# Patient Record
Sex: Female | Born: 1950 | Race: White | Hispanic: No | Marital: Married | Smoking: Former smoker
Health system: Southern US, Community
[De-identification: ages and names within clinical notes are randomized; demographics above are authoritative.]

## PROBLEM LIST (undated history)

## (undated) DIAGNOSIS — J302 Other seasonal allergic rhinitis: Secondary | ICD-10-CM

## (undated) DIAGNOSIS — K219 Gastro-esophageal reflux disease without esophagitis: Secondary | ICD-10-CM

## (undated) DIAGNOSIS — J45909 Unspecified asthma, uncomplicated: Secondary | ICD-10-CM

## (undated) DIAGNOSIS — K5790 Diverticulosis of intestine, part unspecified, without perforation or abscess without bleeding: Secondary | ICD-10-CM

## (undated) DIAGNOSIS — R51 Headache: Secondary | ICD-10-CM

## (undated) DIAGNOSIS — I671 Cerebral aneurysm, nonruptured: Secondary | ICD-10-CM

## (undated) HISTORY — DX: Other seasonal allergic rhinitis: J30.2

## (undated) HISTORY — PX: COLONOSCOPY W/ BIOPSIES AND POLYPECTOMY: SHX1376

## (undated) HISTORY — DX: Cerebral aneurysm, nonruptured: I67.1

---

## 1978-03-08 HISTORY — PX: FACIAL FRACTURE SURGERY: SHX1570

## 1992-03-08 HISTORY — PX: ROTATOR CUFF REPAIR: SHX139

## 2007-03-09 HISTORY — PX: TENDON REPAIR: SHX5111

## 2010-06-08 ENCOUNTER — Other Ambulatory Visit (HOSPITAL_COMMUNITY): Payer: Self-pay | Admitting: *Deleted

## 2010-06-08 DIAGNOSIS — Z1231 Encounter for screening mammogram for malignant neoplasm of breast: Secondary | ICD-10-CM

## 2010-06-29 ENCOUNTER — Ambulatory Visit (HOSPITAL_COMMUNITY)
Admission: RE | Admit: 2010-06-29 | Discharge: 2010-06-29 | Disposition: A | Discharge status: Home / Self Care | Payer: BC Managed Care – PPO | Source: Ambulatory Visit | Attending: Gynecology | Admitting: Gynecology

## 2010-06-29 DIAGNOSIS — Z1231 Encounter for screening mammogram for malignant neoplasm of breast: Secondary | ICD-10-CM

## 2012-03-29 ENCOUNTER — Other Ambulatory Visit (HOSPITAL_COMMUNITY): Payer: Self-pay | Admitting: *Deleted

## 2012-03-29 DIAGNOSIS — Z1231 Encounter for screening mammogram for malignant neoplasm of breast: Secondary | ICD-10-CM

## 2012-03-31 ENCOUNTER — Ambulatory Visit (HOSPITAL_COMMUNITY)
Admission: RE | Admit: 2012-03-31 | Discharge: 2012-03-31 | Disposition: A | Discharge status: Home / Self Care | Payer: BC Managed Care – PPO | Source: Ambulatory Visit | Attending: Internal Medicine | Admitting: Internal Medicine

## 2012-03-31 DIAGNOSIS — Z1231 Encounter for screening mammogram for malignant neoplasm of breast: Secondary | ICD-10-CM | POA: Insufficient documentation

## 2012-05-19 ENCOUNTER — Encounter: Payer: Self-pay | Admitting: Internal Medicine

## 2012-06-13 ENCOUNTER — Encounter: Payer: Self-pay | Admitting: Internal Medicine

## 2012-06-13 ENCOUNTER — Ambulatory Visit (AMBULATORY_SURGERY_CENTER): Payer: BC Managed Care – PPO | Admitting: *Deleted

## 2012-06-13 ENCOUNTER — Telehealth: Payer: Self-pay | Admitting: *Deleted

## 2012-06-13 VITALS — Ht 66.0 in | Wt 139.6 lb

## 2012-06-13 DIAGNOSIS — Z1211 Encounter for screening for malignant neoplasm of colon: Secondary | ICD-10-CM

## 2012-06-13 MED ORDER — MOVIPREP 100 G PO SOLR: ORAL | Status: DC

## 2012-06-13 NOTE — Telephone Encounter (Signed)
Pt scheduled for direct colonoscopy with Dr. Juanda Chance on 06/21/2012.  Last colonoscopy 2005 with Dr. Ewell Poe in [place], [place]. Per pathology report; diminutive sessile tubular adenoma at splenic flexure.  Pt brought procedure report and pathology report to PV appt.  Reports placed on Dr. Regino Schultze desk for review.  Dottie; I told pt in PV that you would call her after Dr. Juanda Chance reviews reports to confirm that she should keep colonoscopy appt scheduled for 4/16.  Thanks, Olegario Messier

## 2012-06-13 NOTE — Telephone Encounter (Signed)
Noted  

## 2012-06-13 NOTE — Progress Notes (Signed)
Pt scheduled for direct colonoscopy with Dr. Juanda Chance on 06/21/2012.  Last colonoscopy 2005 with Dr. Ewell Poe in [place], [place].  Pt brought procedure report and pathology report to PV appt.  Reports placed on Dr. Regino Schultze desk for review.

## 2012-06-14 ENCOUNTER — Encounter: Payer: Self-pay | Admitting: Internal Medicine

## 2012-06-14 NOTE — Telephone Encounter (Signed)
Dr Juanda Chance has reviewed patient's colonoscopy procedure and pathology from Alexandra Dennis ([facility]) done 07/22/03. Dr Juanda Chance states that patient's recall colonoscopy is due at this time. Patient is already scheduled for colonoscopy on 06/21/12.

## 2012-06-14 NOTE — Telephone Encounter (Signed)
Patient advised that Dr Juanda Chance says she is due for colonoscopy, therefore, she should keep her appointment which is scheduled for 06/21/12. She verbalizes understanding.

## 2012-06-21 ENCOUNTER — Ambulatory Visit (AMBULATORY_SURGERY_CENTER): Payer: BC Managed Care – PPO | Admitting: Internal Medicine

## 2012-06-21 ENCOUNTER — Encounter: Payer: Self-pay | Admitting: Internal Medicine

## 2012-06-21 VITALS — BP 123/77 | HR 67 | Temp 99.4°F | Resp 17 | Ht 66.0 in | Wt 139.0 lb

## 2012-06-21 DIAGNOSIS — D126 Benign neoplasm of colon, unspecified: Secondary | ICD-10-CM

## 2012-06-21 DIAGNOSIS — Z1211 Encounter for screening for malignant neoplasm of colon: Secondary | ICD-10-CM

## 2012-06-21 DIAGNOSIS — Z86010 Personal history of colonic polyps: Secondary | ICD-10-CM

## 2012-06-21 MED ORDER — SODIUM CHLORIDE 0.9 % IV SOLN: 500.0000 mL | INTRAVENOUS | Status: DC

## 2012-06-21 NOTE — Progress Notes (Signed)
No complaints noted in the recovery room. Maw   

## 2012-06-21 NOTE — Progress Notes (Signed)
Lidocaine-40mg IV prior to Propofol InductionPropofol given over incremental dosages 

## 2012-06-21 NOTE — Progress Notes (Signed)
Patient did not have preoperative order for IV antibiotic SSI prophylaxis. (G8918)Patient did not experience any of the following events: a burn prior to discharge; a fall within the facility; wrong site/side/patient/procedure/implant event; or a hospital transfer or hospital admission upon discharge from the facility. [phone])   Pt had to wait for Dr. Juanda Chance to speak with her before discharge.  Maw  Pt hands were very cold and I placed a warm pack to her hands.  She states, "it feels much better:Marland Kitchen Maw

## 2012-06-21 NOTE — Op Note (Signed)
[facility] [address] [place], [address]   COLONOSCOPY PROCEDURE REPORT  PATIENT: Alexandra Dennis, Alexandra Dennis  MR#: [id number] BIRTHDATE: 06/11/1950 , 61  yrs. old GENDER: Female ENDOSCOPIST: Hart Carwin, MD REFERRED BY:  Kellie Shropshire, M.D. PROCEDURE DATE:  06/21/2012 PROCEDURE:   Colonoscopy with cold biopsy polypectomy ASA CLASS:   Class II INDICATIONS:colonoscopy in 07/2003 in [place]- tubular adenoma removed from splenic flexure. MEDICATIONS: MAC sedation, administered by CRNA and propofol (Diprivan) 300mg  IV  DESCRIPTION OF PROCEDURE:   After the risks and benefits and of the procedure were explained, informed consent was obtained.  A digital rectal exam revealed no abnormalities of the rectum.    The LB [id number]-[id number]  endoscope was introduced through the anus and advanced to the cecum, which was identified by both the appendix and ileocecal valve .  The quality of the prep was excellent, using MoviPrep .  The instrument was then slowly withdrawn as the colon was fully examined.     COLON FINDINGS: A smooth sessile polyp ranging between 3-85mm in size was found in the sigmoid colon.at 40 cm, A polypectomy was performed with cold forceps.  The resection was complete and the polyp tissue was completely retrieved.   There was moderate diverticulosis noted in the sigmoid colon with associated angulation, tortuosity, muscular hypertrophy and luminal narrowing. Pediatric colonoscope unable to negotiate the turn at 20 cm, so upper endoscope was used to perform the colonoscopy to the cecum. Moderate sized internal hemorrhoids were found.     Retroflexed views revealed internal hemorrhoids.     The scope was then withdrawn from the patient and the procedure completed.  COMPLICATIONS: There were no complications. ENDOSCOPIC IMPRESSION: 1.   Sessile polyp ranging between 3-98mm in size was found in the sigmoid colon; polypectomy was performed with cold  forceps 2.   There was moderate diverticulosis noted in the sigmoid colon, sharp angulation at the pelvic rim, 20 cm,  upper endoscope passed with some difficulty 3.   Moderate sized internal hemorrhoids  RECOMMENDATIONS: 1.  Await pathology results 2.  High fiber diet 3. soluable fiber 2 tsp daily   REPEAT EXAM: .  pending polyp pathology  cc:  _______________________________ eSignedHart Carwin, MD 06/21/2012 1:02 PM     PATIENT NAME:  Alexandra Dennis, Alexandra Dennis MR#: [id number]

## 2012-06-21 NOTE — Progress Notes (Signed)
Called to room to assist during endoscopic procedure.  Patient ID and intended procedure confirmed with present staff. Received instructions for my participation in the procedure from the performing physician.  

## 2012-06-21 NOTE — Patient Instructions (Signed)
Handouts were given to your care partner on polyps, diverticulosis, high fiber diet and hemorrhoids.  Per Dr. Juanda Chance add soulable fiber 2 teaspoons daily.  You may resume your current medications today.  Await pathology results.  Please call if any questions or concerns.     YOU HAD AN ENDOSCOPIC PROCEDURE TODAY AT THE [facility]: Refer to the procedure report that was given to you for any specific questions about what was found during the examination.  If the procedure report does not answer your questions, please call your gastroenterologist to clarify.  If you requested that your care partner not be given the details of your procedure findings, then the procedure report has been included in a sealed envelope for you to review at your convenience later.  YOU SHOULD EXPECT: Some feelings of bloating in the abdomen. Passage of more gas than usual.  Walking can help get rid of the air that was put into your GI tract during the procedure and reduce the bloating. If you had a lower endoscopy (such as a colonoscopy or flexible sigmoidoscopy) you may notice spotting of blood in your stool or on the toilet paper. If you underwent a bowel prep for your procedure, then you may not have a normal bowel movement for a few days.  DIET: Your first meal following the procedure should be a light meal and then it is ok to progress to your normal diet.  A half-sandwich or bowl of soup is an example of a good first meal.  Heavy or fried foods are harder to digest and may make you feel nauseous or bloated.  Likewise meals heavy in dairy and vegetables can cause extra gas to form and this can also increase the bloating.  Drink plenty of fluids but you should avoid alcoholic beverages for 24 hours.  ACTIVITY: Your care partner should take you home directly after the procedure.  You should plan to take it easy, moving slowly for the rest of the day.  You can resume normal activity the day after the procedure  however you should NOT DRIVE or use heavy machinery for 24 hours (because of the sedation medicines used during the test).    SYMPTOMS TO REPORT IMMEDIATELY: A gastroenterologist can be reached at any hour.  During normal business hours, 8:30 AM to 5:00 PM Monday through Friday, call [phone].  After hours and on weekends, please call the GI answering service at [phone] who will take a message and have the physician on call contact you.   Following lower endoscopy (colonoscopy or flexible sigmoidoscopy):  Excessive amounts of blood in the stool  Significant tenderness or worsening of abdominal pains  Swelling of the abdomen that is new, acute  Fever of 100F or higher    FOLLOW UP: If any biopsies were taken you will be contacted by phone or by letter within the next 1-3 weeks.  Call your gastroenterologist if you have not heard about the biopsies in 3 weeks.  Our staff will call the home number listed on your records the next business day following your procedure to check on you and address any questions or concerns that you may have at that time regarding the information given to you following your procedure. This is a courtesy call and so if there is no answer at the home number and we have not heard from you through the emergency physician on call, we will assume that you have returned to your regular daily activities without  incident.  SIGNATURES/CONFIDENTIALITY: You and/or your care partner have signed paperwork which will be entered into your electronic medical record.  These signatures attest to the fact that that the information above on your After Visit Summary has been reviewed and is understood.  Full responsibility of the confidentiality of this discharge information lies with you and/or your care-partner.

## 2012-06-22 ENCOUNTER — Telehealth: Payer: Self-pay

## 2012-06-22 NOTE — Telephone Encounter (Signed)
  Follow up Call-  Call back number 06/21/2012  Post procedure Call Back phone  # [phone]  Permission to leave phone message Yes     Patient questions:  Do you have a fever, pain , or abdominal swelling? no Pain Score  0 *  Have you tolerated food without any problems? yes  Have you been able to return to your normal activities? yes  Do you have any questions about your discharge instructions: Diet   no Medications  no Follow up visit  no  Do you have questions or concerns about your Care? no  Actions: * If pain score is 4 or above: No action needed, pain <4.

## 2012-06-27 ENCOUNTER — Encounter: Payer: BC Managed Care – PPO | Admitting: Internal Medicine

## 2012-06-27 ENCOUNTER — Encounter: Payer: Self-pay | Admitting: Internal Medicine

## 2012-11-15 ENCOUNTER — Other Ambulatory Visit (HOSPITAL_COMMUNITY): Payer: Self-pay | Admitting: Internal Medicine

## 2012-11-15 ENCOUNTER — Other Ambulatory Visit (HOSPITAL_COMMUNITY): Payer: Self-pay | Admitting: Interventional Radiology

## 2012-11-15 DIAGNOSIS — I720 Aneurysm of carotid artery: Secondary | ICD-10-CM

## 2012-11-30 ENCOUNTER — Ambulatory Visit (HOSPITAL_COMMUNITY)
Admission: RE | Admit: 2012-11-30 | Discharge: 2012-11-30 | Disposition: A | Discharge status: Home / Self Care | Payer: BC Managed Care – PPO | Source: Ambulatory Visit | Attending: Internal Medicine | Admitting: Internal Medicine

## 2012-11-30 DIAGNOSIS — I720 Aneurysm of carotid artery: Secondary | ICD-10-CM

## 2013-01-23 ENCOUNTER — Other Ambulatory Visit (HOSPITAL_COMMUNITY): Payer: Self-pay | Admitting: Interventional Radiology

## 2013-01-23 DIAGNOSIS — I729 Aneurysm of unspecified site: Secondary | ICD-10-CM

## 2013-02-05 ENCOUNTER — Other Ambulatory Visit (HOSPITAL_COMMUNITY): Payer: BC Managed Care – PPO

## 2013-02-05 ENCOUNTER — Ambulatory Visit (HOSPITAL_COMMUNITY)
Admission: RE | Admit: 2013-02-05 | Discharge: 2013-02-05 | Disposition: A | Discharge status: Home / Self Care | Payer: BC Managed Care – PPO | Source: Ambulatory Visit | Attending: Interventional Radiology | Admitting: Interventional Radiology

## 2013-02-05 ENCOUNTER — Other Ambulatory Visit (HOSPITAL_COMMUNITY): Payer: Self-pay | Admitting: Interventional Radiology

## 2013-02-05 DIAGNOSIS — I671 Cerebral aneurysm, nonruptured: Secondary | ICD-10-CM | POA: Insufficient documentation

## 2013-02-05 DIAGNOSIS — I729 Aneurysm of unspecified site: Secondary | ICD-10-CM

## 2013-02-05 DIAGNOSIS — M47812 Spondylosis without myelopathy or radiculopathy, cervical region: Secondary | ICD-10-CM | POA: Insufficient documentation

## 2013-02-05 MED ORDER — IOHEXOL 350 MG/ML SOLN
50.0000 mL | Freq: Once | INTRAVENOUS | Status: AC | PRN
  Administered 2013-02-05: 50 mL via INTRAVENOUS

## 2013-02-06 ENCOUNTER — Other Ambulatory Visit (HOSPITAL_COMMUNITY): Payer: Self-pay | Admitting: Interventional Radiology

## 2013-02-06 DIAGNOSIS — I729 Aneurysm of unspecified site: Secondary | ICD-10-CM

## 2013-02-08 ENCOUNTER — Ambulatory Visit (HOSPITAL_COMMUNITY)
Admission: RE | Admit: 2013-02-08 | Discharge: 2013-02-08 | Disposition: A | Discharge status: Home / Self Care | Payer: BC Managed Care – PPO | Source: Ambulatory Visit | Attending: Interventional Radiology | Admitting: Interventional Radiology

## 2013-02-08 DIAGNOSIS — I729 Aneurysm of unspecified site: Secondary | ICD-10-CM

## 2013-02-14 ENCOUNTER — Other Ambulatory Visit (HOSPITAL_COMMUNITY): Payer: Self-pay | Admitting: Interventional Radiology

## 2013-02-14 DIAGNOSIS — I729 Aneurysm of unspecified site: Secondary | ICD-10-CM

## 2013-02-22 ENCOUNTER — Other Ambulatory Visit (HOSPITAL_COMMUNITY): Payer: Self-pay | Admitting: Radiology

## 2013-02-27 ENCOUNTER — Encounter (HOSPITAL_COMMUNITY): Payer: Self-pay | Admitting: Pharmacy Technician

## 2013-02-28 NOTE — Pre-Procedure Instructions (Signed)
[name]  02/28/2013   Your procedure is scheduled on:  03/14/12  Report to [facility] short stay admitting at 630 AM.  Call this number if you have problems the morning of surgery: [phone]   Remember:   Do not eat food or drink liquids after midnight.   Take these medicines the morning of surgery with A SIP OF WATER:    Do not wear jewelry, make-up or nail polish.  Do not wear lotions, powders, or perfumes. You may wear deodorant.  Do not shave 48 hours prior to surgery. Men may shave face and neck.  Do not bring valuables to the hospital.  [facility] is not responsible                  for any belongings or valuables.               Contacts, dentures or bridgework may not be worn into surgery.  Leave suitcase in the car. After surgery it may be brought to your room.  For patients admitted to the hospital, discharge time is determined by your                treatment team.               Patients discharged the day of surgery will not be allowed to drive  home.  Name and phone number of your driver:   Special Instructions: Shower using CHG 2 nights before surgery and the night before surgery.  If you shower the day of surgery use CHG.  Use special wash - you have one bottle of CHG for all showers.  You should use approximately 1/3 of the bottle for each shower.   Please read over the following fact sheets that you were given: Pain Booklet, Coughing and Deep Breathing and Surgical Site Infection Prevention

## 2013-03-02 ENCOUNTER — Encounter (HOSPITAL_COMMUNITY)
Admission: RE | Admit: 2013-03-02 | Discharge: 2013-03-02 | Disposition: A | Discharge status: Home / Self Care | Payer: BC Managed Care – PPO | Source: Ambulatory Visit | Attending: Interventional Radiology | Admitting: Interventional Radiology

## 2013-03-02 ENCOUNTER — Encounter (HOSPITAL_COMMUNITY): Payer: Self-pay

## 2013-03-02 DIAGNOSIS — Z01812 Encounter for preprocedural laboratory examination: Secondary | ICD-10-CM | POA: Insufficient documentation

## 2013-03-02 DIAGNOSIS — Z01818 Encounter for other preprocedural examination: Secondary | ICD-10-CM | POA: Insufficient documentation

## 2013-03-02 HISTORY — DX: Unspecified asthma, uncomplicated: J45.909

## 2013-03-02 HISTORY — DX: Gastro-esophageal reflux disease without esophagitis: K21.9

## 2013-03-02 HISTORY — DX: Diverticulosis of intestine, part unspecified, without perforation or abscess without bleeding: K57.90

## 2013-03-02 HISTORY — DX: Headache: R51

## 2013-03-02 LAB — COMPREHENSIVE METABOLIC PANEL
ALT: 19 U/L (ref 0–35)
AST: 19 U/L (ref 0–37)
Albumin: 3.9 g/dL (ref 3.5–5.2)
CO2: 28 mEq/L (ref 19–32)
Calcium: 9.1 mg/dL (ref 8.4–10.5)
Creatinine, Ser: 0.72 mg/dL (ref 0.50–1.10)
GFR calc non Af Amer: >90 mL/min (ref >90)
Sodium: 141 mEq/L (ref 135–145)
Total Bilirubin: 0.2 mg/dL — ABNORMAL LOW (ref 0.3–1.2)
Total Protein: 7 g/dL (ref 6.0–8.3)

## 2013-03-02 LAB — CBC WITH DIFFERENTIAL/PLATELET
Basophils Absolute: 0 10*3/uL (ref 0.0–0.1)
Basophils Relative: 0 % (ref 0–1)
Eosinophils Absolute: 0.2 10*3/uL (ref 0.0–0.7)
Eosinophils Relative: 3 % (ref 0–5)
HCT: 40.4 % (ref 36.0–46.0)
Lymphocytes Relative: 34 % (ref 12–46)
MCH: 30.7 pg (ref 26.0–34.0)
MCHC: 33.7 g/dL (ref 30.0–36.0)
MCV: 91.2 fL (ref 78.0–100.0)
Monocytes Absolute: 0.6 10*3/uL (ref 0.1–1.0)
Platelets: 308 10*3/uL (ref 150–400)
RBC: 4.43 MIL/uL (ref 3.87–5.11)
RDW: 12.9 % (ref 11.5–15.5)
WBC: 7.1 10*3/uL (ref 4.0–10.5)

## 2013-03-02 LAB — PROTIME-INR: INR: 0.89 (ref 0.00–1.49)

## 2013-03-05 ENCOUNTER — Other Ambulatory Visit (HOSPITAL_COMMUNITY): Payer: Self-pay | Admitting: Radiology

## 2013-03-05 ENCOUNTER — Encounter (HOSPITAL_COMMUNITY): Payer: Self-pay
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
## 2013-03-09 ENCOUNTER — Other Ambulatory Visit (HOSPITAL_COMMUNITY): Payer: Self-pay | Admitting: Radiology

## 2013-03-12 ENCOUNTER — Encounter (HOSPITAL_COMMUNITY): Payer: Self-pay | Admitting: Pharmacy Technician

## 2013-03-14 ENCOUNTER — Inpatient Hospital Stay (HOSPITAL_COMMUNITY)
Admission: RE | Admit: 2013-03-14 | Discharge: 2013-03-15 | DRG: 027 | Disposition: A | Discharge status: Home / Self Care | Payer: BC Managed Care – PPO | Source: Ambulatory Visit | Attending: Interventional Radiology | Admitting: Interventional Radiology

## 2013-03-14 ENCOUNTER — Encounter (HOSPITAL_COMMUNITY)
Admission: RE | Disposition: A | Discharge status: Home / Self Care | Payer: Self-pay | Source: Ambulatory Visit | Attending: Interventional Radiology

## 2013-03-14 ENCOUNTER — Ambulatory Visit (HOSPITAL_COMMUNITY)
Admission: RE | Admit: 2013-03-14 | Discharge: 2013-03-14 | Disposition: A | Discharge status: Home / Self Care | Payer: BC Managed Care – PPO | Source: Ambulatory Visit | Attending: Interventional Radiology | Admitting: Interventional Radiology

## 2013-03-14 ENCOUNTER — Ambulatory Visit (HOSPITAL_COMMUNITY): Payer: BC Managed Care – PPO | Admitting: Certified Registered"

## 2013-03-14 ENCOUNTER — Encounter (HOSPITAL_COMMUNITY): Payer: Self-pay | Admitting: *Deleted

## 2013-03-14 ENCOUNTER — Encounter (HOSPITAL_COMMUNITY): Payer: BC Managed Care – PPO | Admitting: Vascular Surgery

## 2013-03-14 ENCOUNTER — Encounter (HOSPITAL_COMMUNITY): Payer: Self-pay

## 2013-03-14 DIAGNOSIS — Z7902 Long term (current) use of antithrombotics/antiplatelets: Secondary | ICD-10-CM

## 2013-03-14 DIAGNOSIS — I729 Aneurysm of unspecified site: Secondary | ICD-10-CM

## 2013-03-14 DIAGNOSIS — K219 Gastro-esophageal reflux disease without esophagitis: Secondary | ICD-10-CM | POA: Diagnosis present

## 2013-03-14 DIAGNOSIS — I671 Cerebral aneurysm, nonruptured: Secondary | ICD-10-CM

## 2013-03-14 DIAGNOSIS — Z7982 Long term (current) use of aspirin: Secondary | ICD-10-CM

## 2013-03-14 DIAGNOSIS — Z87891 Personal history of nicotine dependence: Secondary | ICD-10-CM

## 2013-03-14 HISTORY — PX: RADIOLOGY WITH ANESTHESIA: SHX6223

## 2013-03-14 LAB — ABO/RH: ABO/RH(D): B POS

## 2013-03-14 LAB — POCT ACTIVATED CLOTTING TIME
ACTIVATED CLOTTING TIME: 143 s
ACTIVATED CLOTTING TIME: 177 s

## 2013-03-14 LAB — HEPARIN LEVEL (UNFRACTIONATED): HEPARIN UNFRACTIONATED: 0.25 [IU]/mL — AB (ref 0.30–0.70)

## 2013-03-14 LAB — TYPE AND SCREEN
ABO/RH(D): B POS
Antibody Screen: NEGATIVE

## 2013-03-14 LAB — MRSA PCR SCREENING: MRSA BY PCR: NEGATIVE

## 2013-03-14 LAB — PLATELET INHIBITION P2Y12: Platelet Function  P2Y12: 8 [PRU] — ABNORMAL LOW (ref 194–418)

## 2013-03-14 SURGERY — RADIOLOGY WITH ANESTHESIA
Anesthesia: Monitor Anesthesia Care

## 2013-03-14 MED ORDER — PROMETHAZINE HCL 25 MG/ML IJ SOLN: 6.2500 mg | INTRAMUSCULAR | Status: DC | PRN

## 2013-03-14 MED ORDER — PANTOPRAZOLE SODIUM 40 MG IV SOLR
40.0000 mg | INTRAVENOUS | Status: DC
  Filled 2013-03-14: qty 40

## 2013-03-14 MED ORDER — CLOPIDOGREL BISULFATE 75 MG PO TABS
75.0000 mg | ORAL_TABLET | ORAL | Status: DC
  Filled 2013-03-14: qty 1

## 2013-03-14 MED ORDER — SODIUM CHLORIDE 0.9 % IV SOLN
Freq: Once | INTRAVENOUS | Status: AC
  Administered 2013-03-14: 08:00:00 via INTRAVENOUS

## 2013-03-14 MED ORDER — LACTATED RINGERS IV SOLN
INTRAVENOUS | Status: DC | PRN
  Administered 2013-03-14 (×2): via INTRAVENOUS

## 2013-03-14 MED ORDER — SODIUM CHLORIDE 0.9 % IV SOLN
INTRAVENOUS | Status: DC
  Administered 2013-03-14: 15:00:00 via INTRAVENOUS

## 2013-03-14 MED ORDER — PROPOFOL 10 MG/ML IV BOLUS
INTRAVENOUS | Status: DC | PRN
  Administered 2013-03-14: 150 mg via INTRAVENOUS

## 2013-03-14 MED ORDER — MEPERIDINE HCL 25 MG/ML IJ SOLN: 6.2500 mg | INTRAMUSCULAR | Status: DC | PRN

## 2013-03-14 MED ORDER — HEPARIN (PORCINE) IN NACL 100-0.45 UNIT/ML-% IJ SOLN
500.0000 [IU]/h | INTRAMUSCULAR | Status: AC
  Administered 2013-03-14: 500 [IU]/h via INTRAVENOUS
  Filled 2013-03-14: qty 250

## 2013-03-14 MED ORDER — EPHEDRINE SULFATE 50 MG/ML IJ SOLN
INTRAMUSCULAR | Status: DC | PRN
  Administered 2013-03-14: 5 mg via INTRAVENOUS

## 2013-03-14 MED ORDER — ASPIRIN EC 325 MG PO TBEC
325.0000 mg | DELAYED_RELEASE_TABLET | ORAL | Status: DC
  Filled 2013-03-14: qty 1

## 2013-03-14 MED ORDER — ROCURONIUM BROMIDE 100 MG/10ML IV SOLN
INTRAVENOUS | Status: DC | PRN
  Administered 2013-03-14: 40 mg via INTRAVENOUS

## 2013-03-14 MED ORDER — OXYCODONE HCL 5 MG/5ML PO SOLN: 5.0000 mg | Freq: Once | ORAL | Status: DC | PRN

## 2013-03-14 MED ORDER — OXYCODONE HCL 5 MG PO TABS: 5.0000 mg | ORAL_TABLET | Freq: Once | ORAL | Status: DC | PRN

## 2013-03-14 MED ORDER — NIMODIPINE 30 MG PO CAPS
ORAL_CAPSULE | ORAL | Status: AC
  Filled 2013-03-14: qty 2

## 2013-03-14 MED ORDER — IOHEXOL 300 MG/ML  SOLN
150.0000 mL | Freq: Once | INTRAMUSCULAR | Status: AC | PRN
  Administered 2013-03-14: 135 mL via INTRAVENOUS

## 2013-03-14 MED ORDER — MIDAZOLAM HCL 5 MG/5ML IJ SOLN
INTRAMUSCULAR | Status: DC | PRN
  Administered 2013-03-14: 1 mg via INTRAVENOUS
  Administered 2013-03-14: 2 mg via INTRAVENOUS
  Administered 2013-03-14: 1 mg via INTRAVENOUS

## 2013-03-14 MED ORDER — CEFAZOLIN SODIUM-DEXTROSE 2-3 GM-% IV SOLR
INTRAVENOUS | Status: AC
  Filled 2013-03-14: qty 50

## 2013-03-14 MED ORDER — HEPARIN SODIUM (PORCINE) 1000 UNIT/ML IJ SOLN
INTRAMUSCULAR | Status: DC | PRN
  Administered 2013-03-14: 500 [IU] via INTRAVENOUS
  Administered 2013-03-14: 3000 [IU] via INTRAVENOUS

## 2013-03-14 MED ORDER — GELATIN ABSORBABLE 12-7 MM EX MISC
CUTANEOUS | Status: AC
  Filled 2013-03-14: qty 1

## 2013-03-14 MED ORDER — FENTANYL CITRATE 0.05 MG/ML IJ SOLN
INTRAMUSCULAR | Status: DC | PRN
  Administered 2013-03-14: 50 ug via INTRAVENOUS
  Administered 2013-03-14: 100 ug via INTRAVENOUS
  Administered 2013-03-14 (×2): 50 ug via INTRAVENOUS

## 2013-03-14 MED ORDER — CLOPIDOGREL BISULFATE 75 MG PO TABS
75.0000 mg | ORAL_TABLET | Freq: Every day | ORAL | Status: DC
  Administered 2013-03-15: 75 mg via ORAL
  Filled 2013-03-14 (×2): qty 1

## 2013-03-14 MED ORDER — ONDANSETRON HCL 4 MG/2ML IJ SOLN: 4.0000 mg | Freq: Four times a day (QID) | INTRAMUSCULAR | Status: DC | PRN

## 2013-03-14 MED ORDER — ONDANSETRON HCL 4 MG/2ML IJ SOLN
INTRAMUSCULAR | Status: DC | PRN
  Administered 2013-03-14: 4 mg via INTRAVENOUS

## 2013-03-14 MED ORDER — ASPIRIN 325 MG PO TABS
325.0000 mg | ORAL_TABLET | Freq: Every day | ORAL | Status: DC
  Administered 2013-03-15: 325 mg via ORAL
  Filled 2013-03-14 (×2): qty 1

## 2013-03-14 MED ORDER — NITROGLYCERIN 1 MG/10 ML FOR IR/CATH LAB
INTRA_ARTERIAL | Status: AC
  Filled 2013-03-14: qty 10

## 2013-03-14 MED ORDER — NICARDIPINE HCL IN NACL 20-0.86 MG/200ML-% IV SOLN: 5.0000 mg/h | INTRAVENOUS | Status: DC

## 2013-03-14 MED ORDER — NIMODIPINE 30 MG PO CAPS
60.0000 mg | ORAL_CAPSULE | ORAL | Status: AC
  Administered 2013-03-14: 60 mg via ORAL
  Filled 2013-03-14: qty 2

## 2013-03-14 MED ORDER — NEOSTIGMINE METHYLSULFATE 1 MG/ML IJ SOLN
INTRAMUSCULAR | Status: DC | PRN
  Administered 2013-03-14: 3 mg via INTRAVENOUS

## 2013-03-14 MED ORDER — ACETAMINOPHEN 650 MG RE SUPP
650.0000 mg | Freq: Four times a day (QID) | RECTAL | Status: DC | PRN
Start: 2013-03-14 — End: 2013-03-15

## 2013-03-14 MED ORDER — LIDOCAINE HCL (CARDIAC) 20 MG/ML IV SOLN
INTRAVENOUS | Status: DC | PRN
  Administered 2013-03-14 (×2): 30 mg via INTRAVENOUS

## 2013-03-14 MED ORDER — CEFAZOLIN SODIUM-DEXTROSE 2-3 GM-% IV SOLR
2.0000 g | Freq: Once | INTRAVENOUS | Status: AC
  Administered 2013-03-14: 2 g via INTRAVENOUS
  Filled 2013-03-14: qty 50

## 2013-03-14 MED ORDER — PANTOPRAZOLE SODIUM 40 MG IV SOLR
40.0000 mg | Freq: Once | INTRAVENOUS | Status: AC
  Administered 2013-03-14: 40 mg via INTRAVENOUS

## 2013-03-14 MED ORDER — DEXAMETHASONE SODIUM PHOSPHATE 4 MG/ML IJ SOLN
INTRAMUSCULAR | Status: DC | PRN
Start: 2013-03-14 — End: 2013-03-14
  Administered 2013-03-14: 10 mg via INTRAVENOUS

## 2013-03-14 MED ORDER — FENTANYL CITRATE 0.05 MG/ML IJ SOLN: 25.0000 ug | INTRAMUSCULAR | Status: DC | PRN

## 2013-03-14 MED ORDER — DEXTROSE 5 % IV SOLN
10.0000 mg | INTRAVENOUS | Status: DC | PRN
  Administered 2013-03-14: 25 ug/min via INTRAVENOUS

## 2013-03-14 MED ORDER — GLYCOPYRROLATE 0.2 MG/ML IJ SOLN
INTRAMUSCULAR | Status: DC | PRN
  Administered 2013-03-14: .4 mg via INTRAVENOUS
  Administered 2013-03-14: .2 mg via INTRAVENOUS

## 2013-03-14 MED ORDER — ACETAMINOPHEN 500 MG PO TABS
1000.0000 mg | ORAL_TABLET | Freq: Four times a day (QID) | ORAL | Status: DC | PRN
  Administered 2013-03-14: 1000 mg via ORAL
  Filled 2013-03-14: qty 2

## 2013-03-14 NOTE — Preoperative (Signed)
Beta Blockers   Reason not to administer Beta Blockers:Not Applicable 

## 2013-03-14 NOTE — Progress Notes (Signed)
Subjective: Patient with no complaints of right groin pain or swelling, headache or left eye pain. Patient denies any N/V. She states her post intubation oral bleeding has stopped with no recurrence. She is doing well with ice chips. Family present during evaluation.   Objective: Physical Exam: BP 110/67  Pulse 72  Temp(Src) 97.4 F (36.3 C) (Oral)  Resp 19  Ht 5' 6.14" (1.68 m)  Wt 138 lb 3.7 oz (62.7 kg)  BMI 22.22 kg/m2  SpO2 100%  General: A&Ox3, NAD Abd: Soft, NT, ND Right groin site dressing C/D/I, no signs of bleeding or hematoma. Ext: DP intact bilaterally. Neuro: CN grossly intact, speech clear, smile symmetrical, tongue midline, EOMI, PERRLA, extremities equal strength bilaterally upper and lower 5/5. Fine motor intact, no ataxia.   Labs: CBC No results found for this basename: WBC, HGB, HCT, PLT,  in the last 72 hours BMET No results found for this basename: NA, K, CL, CO2, GLUCOSE, BUN, CREATININE, CALCIUM,  in the last 72 hours LFT No results found for this basename: PROT, ALBUMIN, AST, ALT, ALKPHOS, BILITOT, BILIDIR, IBILI, LIPASE,  in the last 72 hours PT/INR No results found for this basename: LABPROT, INR,  in the last 72 hours   Studies/Results: No results found.  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 IV heparin until 0700 03/15/13, Aspirin and plavix daily. Continue to monitor BP closely with parameters. Advance diet to full liquid. Will evaluate in am for possible discharge.    LOS: 0 days    Rockney Ghee 03/14/2013 4:06 PM
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
## 2013-03-14 NOTE — Anesthesia Postprocedure Evaluation (Signed)
  Anesthesia Post-op Note  Patient: Alexandra Dennis  Procedure(s) Performed: Procedure(s): aneurysm  embolization   (N/A)  Patient Location: PACU: neuro  Anesthesia Type:General  Level of Consciousness: awake, alert , oriented and patient cooperative  Airway and Oxygen Therapy: Patient Spontanous Breathing and Patient connected to nasal cannula oxygen  Post-op Pain: none  Post-op Assessment: Post-op Vital signs reviewed, Patient's Cardiovascular Status Stable, Respiratory Function Stable, Patent Airway, No signs of Nausea or vomiting and Pain level controlled  Post-op Vital Signs: Reviewed and stable  Complications: No apparent anesthesia complications

## 2013-03-14 NOTE — Consult Note (Signed)
ANTICOAGULATION CONSULT NOTE - Initial Consult  Pharmacy Consult for Heparin Indication: post-embolization of left carotid artery aneurysm  No Known Allergies  Patient Measurements: Height: 5' 6.14" (168 cm) Weight: 138 lb 3.7 oz (62.7 kg) IBW/kg (Calculated) : 59.63 Heparin Dosing Weight: ~63kg  Vital Signs: Temp: 97.7 F (36.5 C) (01/07 1346) Temp src: Oral (01/07 0658) BP: 135/87 mmHg (01/07 0658) Pulse Rate: 69 (01/07 0658)  Labs: No results found for this basename: HGB, HCT, PLT, APTT, LABPROT, INR, HEPARINUNFRC, CREATININE, CKTOTAL, CKMB, TROPONINI,  in the last 72 hours  Estimated Creatinine Clearance: 69.5 ml/min (by C-G formula based on Cr of 0.72).   Medical History: Past Medical History  Diagnosis Date  . Seasonal allergies   . Left cavernous carotid aneurysm   . Diverticulosis     Hx: of  . Asthma     Hx: of exercise induced as a child only  . GERD (gastroesophageal reflux disease)   . Headache(784.0)    Assessment: 62yof s/p 4 vessel cerebral arteriogram with embolization of left carotid artery aneursym to begin IV heparin until 0700 tomorrow. Heparin to be started in the NPACU at 500 units/hr.  Goal of Therapy:  Heparin level 0.1-0.25 units/ml Monitor platelets by anticoagulation protocol: Yes   Plan:  1) Heparin @ 500 units/hr per MD 2) Check 6 hour heparin level and adjust as needed  Deboraha Sprang 03/14/2013,1:55 PM

## 2013-03-14 NOTE — Consult Note (Signed)
ANTICOAGULATION CONSULT NOTE - Initial Consult  Pharmacy Consult for Heparin Indication: post-embolization of left carotid artery aneurysm  No Known Allergies  Patient Measurements: Height: 5\' 6"  (167.6 cm) Weight: 141 lb 8.6 oz (64.2 kg) IBW/kg (Calculated) : 59.3 Heparin Dosing Weight: ~63kg  Vital Signs: Temp: 97.8 F (36.6 C) (01/07 2000) Temp src: Oral (01/07 2000) BP: 101/58 mmHg (01/07 2100) Pulse Rate: 87 (01/07 2100)  Labs:  Recent Labs  03/14/13 2143  HEPARINUNFRC 0.25*    Estimated Creatinine Clearance: 69.1 ml/min (by C-G formula based on Cr of 0.72).   Medical History: Past Medical History  Diagnosis Date  . Seasonal allergies   . Left cavernous carotid aneurysm   . Diverticulosis     Hx: of  . Asthma     Hx: of exercise induced as a child only  . GERD (gastroesophageal reflux disease)   . Headache(784.0)    Assessment: 62yof s/p 4 vessel cerebral arteriogram with embolization of left carotid artery aneursym to begin IV heparin until 0700 tomorrow. Heparin was started in the NPACU at 500 units/hr.  HL 0.25 at goal.   Goal of Therapy:  Heparin level 0.1-0.25 units/ml Monitor platelets by anticoagulation protocol: Yes   Plan:  1) Continue Heparin @ 500 units/hr per MD - unrill thurned off at 0700 03/15/13  Bonnita Nasuti Pharm.D. CPP, BCPS Clinical Pharmacist [phone] 03/14/2013 10:54 PM

## 2013-03-14 NOTE — Transfer of Care (Signed)
Immediate Anesthesia Transfer of Care Note  Patient: Alexandra Dennis  Procedure(s) Performed: Procedure(s): aneurysm  embolization   (N/A)  Patient Location: PACU  Anesthesia Type:General  Level of Consciousness: awake, alert  and oriented  Airway & Oxygen Therapy: Patient Spontanous Breathing and Patient connected to nasal cannula oxygen  Post-op Assessment: Report given to PACU RN and Post -op Vital signs reviewed and stable  Post vital signs: Reviewed and stable  Complications: No apparent anesthesia complications

## 2013-03-14 NOTE — Procedures (Signed)
S/P 4 vessel cerebral arteriogram. RT CFa approach. Findings. Giant approx 24 mmx 33mm,lobulated  Lt ICa prox aneurysm. S/P placement of 51mmx 15mm Pipeline endovascular device.

## 2013-03-14 NOTE — Anesthesia Preprocedure Evaluation (Addendum)
Anesthesia Evaluation  Patient identified by MRN, date of birth, ID band Patient awake    Reviewed: Allergy & Precautions, H&P , NPO status , Patient's Chart, lab work & pertinent test results  History of Anesthesia Complications Negative for: history of anesthetic complications  Airway Mallampati: II TM Distance: >3 FB Neck ROM: Full    Dental  (+) Caps, Dental Advisory Given and Chipped   Pulmonary former smoker (quit '85),  breath sounds clear to auscultation  Pulmonary exam normal       Cardiovascular + Peripheral Vascular Disease (carotid aneurysm) Rhythm:Regular Rate:Normal     Neuro/Psych  Headaches,    GI/Hepatic Neg liver ROS, GERD-  Controlled,  Endo/Other  negative endocrine ROS  Renal/GU negative Renal ROS     Musculoskeletal   Abdominal   Peds  Hematology negative hematology ROS (+)   Anesthesia Other Findings   Reproductive/Obstetrics                          Anesthesia Physical Anesthesia Plan  ASA: III  Anesthesia Plan: General and MAC   Post-op Pain Management:    Induction: Intravenous  Airway Management Planned: Oral ETT  Additional Equipment: Arterial line  Intra-op Plan:   Post-operative Plan: Extubation in OR  Informed Consent:   Dental advisory given  Plan Discussed with: Surgeon and CRNA  Anesthesia Plan Comments: (Plan routine monitors, A line, MAC for angiogram and GETA once proceed with stent)        Anesthesia Quick Evaluation

## 2013-03-14 NOTE — Progress Notes (Signed)
Spoke with Jannifer Franklin, PA to verify p.o. Status.  OK to advance as tolerated.  Clear liquids ordered at this time.  Also spoke about a-line not correlating with cuff and having a whip in the waveform.  OK to go by cuff pressures which are consistently more accurate.  Will continue to monitor.

## 2013-03-15 LAB — CBC WITH DIFFERENTIAL/PLATELET
BASOS ABS: 0 10*3/uL (ref 0.0–0.1)
BASOS PCT: 0 % (ref 0–1)
Eosinophils Absolute: 0 10*3/uL (ref 0.0–0.7)
Eosinophils Relative: 0 % (ref 0–5)
HEMATOCRIT: 33.7 % — AB (ref 36.0–46.0)
Hemoglobin: 11.6 g/dL — ABNORMAL LOW (ref 12.0–15.0)
Lymphocytes Relative: 22 % (ref 12–46)
Lymphs Abs: 1.7 10*3/uL (ref 0.7–4.0)
MCH: 30.6 pg (ref 26.0–34.0)
MCHC: 34.4 g/dL (ref 30.0–36.0)
MCV: 88.9 fL (ref 78.0–100.0)
MONO ABS: 0.5 10*3/uL (ref 0.1–1.0)
Monocytes Relative: 7 % (ref 3–12)
NEUTROS ABS: 5.5 10*3/uL (ref 1.7–7.7)
NEUTROS PCT: 71 % (ref 43–77)
PLATELETS: 278 10*3/uL (ref 150–400)
RBC: 3.79 MIL/uL — ABNORMAL LOW (ref 3.87–5.11)
RDW: 12.8 % (ref 11.5–15.5)
WBC: 7.8 10*3/uL (ref 4.0–10.5)

## 2013-03-15 LAB — BASIC METABOLIC PANEL
BUN: 7 mg/dL (ref 6–23)
CHLORIDE: 109 meq/L (ref 96–112)
CO2: 22 mEq/L (ref 19–32)
CREATININE: 0.59 mg/dL (ref 0.50–1.10)
Calcium: 8.3 mg/dL — ABNORMAL LOW (ref 8.4–10.5)
GFR calc Af Amer: >90 mL/min (ref >90)
Glucose, Bld: 108 mg/dL — ABNORMAL HIGH (ref 70–99)
Potassium: 3.9 mEq/L (ref 3.7–5.3)
Sodium: 142 mEq/L (ref 137–147)

## 2013-03-15 LAB — PLATELET INHIBITION P2Y12: Platelet Function  P2Y12: 12 [PRU] — ABNORMAL LOW (ref 194–418)

## 2013-03-15 NOTE — Discharge Summary (Signed)
Physician Discharge Summary  Patient ID: Alexandra Dennis MRN: [id number] DOB/AGE: [birth year] 62 y.o.  Admit date: 03/14/2013 Discharge date: 03/15/2013  Admission Diagnoses: Left carotid artery cavernous sinus aneurysm  Discharge Diagnoses: embolization of brain aneurysm Active Problems:   Brain aneurysm   Discharged Condition: improved; stable  Hospital Course: Pt has hx of known L carotid artery aneurysm. Was evaluated in [place] 5-6 yrs ago after symptoms of L eye blurriness and headache. Was referred to [facility] where cerebral arteriogram confirmed L carotid artery cavernous sinus aneurysm.  Was followed there without intervention. Sxs resolved and has remained without sxs. Moved from [place] and followed with CTA. Most recent CTA 12/14 revealed increase in size of aneurysm. Referred to Dr Estanislado Pandy for evaluation and treatment.  Pipeline stent was placed 03/14/2013. Procedure went well without complication. Admitted overnight in Neuro ICU. No issues; uneventful overnight stay. Did have minimal oozing from groin site; new bandage; Hep off at 7 am- no bleeding now. Has had a mild headache that resolved with Tylenol.  Denies N/V; denies headache; visual or speech problems. Dr Estanislado Pandy has seen and examined pt. For dc to home now. Continue all home meds including Plavix 75 mg and ASA 81 mg daily.  Consults: None  Significant Diagnostic Studies: Cerebral arteriogram  Treatments: Pipeline stent placed over L carotid artery aneurysm  Discharge Exam: Blood pressure 125/69, pulse 76, temperature 97.4 F (36.3 C), temperature source Oral, resp. rate 15, height 5\' 6"  (1.676 m), weight 141 lb 8.6 oz (64.2 kg), SpO2 99.00%.  PE:  Afeb; vss In NAD; A/O Pleasant Face symmetrical; tongue midline Smile =; EOM; puffs cheeks = Good strength; good movement all 4s Sensation; strength = B Heart: RRR Lungs: CTA Abd: soft; +BS; NT Extr: FROM Rt groin: no hematoma; dry; NT Small ecchymotic area  superior to groin site (was there before procedure per pt) Rt foot: 2+ pulses UOP good; yellow Labs wnl  Results for orders placed during the hospital encounter of 03/14/13  MRSA PCR SCREENING      Result Value Range   MRSA by PCR NEGATIVE  NEGATIVE  PLATELET INHIBITION P2Y12      Result Value Range   Platelet Function  P2Y12 8 (*) 194 - 418 PRU  BASIC METABOLIC PANEL      Result Value Range   Sodium 142  137 - 147 mEq/L   Potassium 3.9  3.7 - 5.3 mEq/L   Chloride 109  96 - 112 mEq/L   CO2 22  19 - 32 mEq/L   Glucose, Bld 108 (*) 70 - 99 mg/dL   BUN 7  6 - 23 mg/dL   Creatinine, Ser 0.59  0.50 - 1.10 mg/dL   Calcium 8.3 (*) 8.4 - 10.5 mg/dL   GFR calc non Af Amer >90  >90 mL/min   GFR calc Af Amer >90  >90 mL/min  CBC WITH DIFFERENTIAL      Result Value Range   WBC 7.8  4.0 - 10.5 K/uL   RBC 3.79 (*) 3.87 - 5.11 MIL/uL   Hemoglobin 11.6 (*) 12.0 - 15.0 g/dL   HCT 33.7 (*) 36.0 - 46.0 %   MCV 88.9  78.0 - 100.0 fL   MCH 30.6  26.0 - 34.0 pg   MCHC 34.4  30.0 - 36.0 g/dL   RDW 12.8  11.5 - 15.5 %   Platelets 278  150 - 400 K/uL   Neutrophils Relative % 71  43 - 77 %   Neutro Abs  5.5  1.7 - 7.7 K/uL   Lymphocytes Relative 22  12 - 46 %   Lymphs Abs 1.7  0.7 - 4.0 K/uL   Monocytes Relative 7  3 - 12 %   Monocytes Absolute 0.5  0.1 - 1.0 K/uL   Eosinophils Relative 0  0 - 5 %   Eosinophils Absolute 0.0  0.0 - 0.7 K/uL   Basophils Relative 0  0 - 1 %   Basophils Absolute 0.0  0.0 - 0.1 K/uL  HEPARIN LEVEL (UNFRACTIONATED)      Result Value Range   Heparin Unfractionated 0.25 (*) 0.30 - 0.70 IU/mL  TYPE AND SCREEN      Result Value Range   ABO/RH(D) B POS     Antibody Screen NEG     Sample Expiration 03/17/2013    ABO/RH      Result Value Range   ABO/RH(D) B POS      Disposition: L carotid artery cavernous sinus aneurysm pipeline stent placed 1/7 in IR with Dr Estanislado Pandy and anesthesia. Doing well; dc to home now. Dr Estanislado Pandy has seen and examined pt. Return 1/21  for follow up. Continue ASA 81 mg and Plavix 75 mg daily. Pt has good understanding of dc instructions. P2Y12 stat now before dc. Call [phone] or [phone] if problems or questions.  Discharge Orders   Future Orders Complete By Expires   Call MD for:  difficulty breathing, headache or visual disturbances  As directed    Call MD for:  extreme fatigue  As directed    Call MD for:  persistant dizziness or light-headedness  As directed    Call MD for:  persistant nausea and vomiting  As directed    Call MD for:  redness, tenderness, or signs of infection (pain, swelling, redness, odor or green/yellow discharge around incision site)  As directed    Call MD for:  severe uncontrolled pain  As directed    Call MD for:  temperature >100.4  As directed    Diet - low sodium heart healthy  As directed    Discharge instructions  As directed    Comments:     Follow up with Dr Estanislado Pandy 1/21 Wed at 100 pm- [facility]; call [phone] with issues or questions   Discharge wound care:  As directed    Comments:     May shower tomorrow; replace bandaid to Rt groin daily x 1 week   Driving Restrictions  As directed    Comments:     No driving x 2 weeks   Increase activity slowly  As directed    IR Radiologist Eval & Mgmt  As directed 05/14/2014   Questions:     Preferred Imaging Location?:  [facility]   Reason for Exam (SYMPTOM  OR DIAGNOSIS REQUIRED):  2 week follow up with Dr Estanislado Pandy   Lifting restrictions  As directed    Comments:     No lifting over 10 lbs x 2 weeks       Medication List         aspirin 325 MG EC tablet  Take 325 mg by mouth daily.     aspirin 81 MG tablet  Take 81 mg by mouth daily.     clopidogrel 75 MG tablet  Commonly known as:  PLAVIX  Take 75 mg by mouth daily with breakfast.     DIGEST II PO  Take 1 tablet by mouth daily as needed (for reflux).  Signed: Eliyohu Class A 03/15/2013, 9:20 AM

## 2013-03-15 NOTE — Progress Notes (Signed)
1 Day Post-Op  Subjective: L carotid artery cavernous sinus aneurysm pipeline stent placed 1/7 in IR with Dr Estanislado Pandy Has done well overnight Rt groin site started to ooze early this am Dressing reinforced and sandbag placed on site Doing well/no complaints  Objective: Vital signs in last 24 hours: Temp:  [97.4 F (36.3 C)-97.8 F (36.6 C)] 97.4 F (36.3 C) (01/08 0400) Pulse Rate:  [53-87] 71 (01/08 0600) Resp:  [10-19] 19 (01/08 0600) BP: (84-118)/(56-67) 112/59 mmHg (01/08 0600) SpO2:  [97 %-100 %] 99 % (01/08 0600) Arterial Line BP: (101-140)/(52-70) 140/68 mmHg (01/08 0600) Weight:  [138 lb 3.7 oz (62.7 kg)-141 lb 8.6 oz (64.2 kg)] 141 lb 8.6 oz (64.2 kg) (01/07 1900)    Intake/Output from previous day: 01/07 0701 - 01/08 0700 In: 3010.1 [P.O.:480; I.V.:2530.1] Out: 3360 [Urine:3360] Intake/Output this shift:    PE:  Afeb; vss Moves all 4s Good strength B Face symm; smile = Tongue midline Rt groin NT; minimal ooze still No hematoma New dressing applied and sandbag Rt foot 2+ pulses Labs wnl  Lab Results:   Recent Labs  03/15/13 0435  WBC 7.8  HGB 11.6*  HCT 33.7*  PLT 278   BMET  Recent Labs  03/15/13 0435  NA 142  K 3.9  CL 109  CO2 22  GLUCOSE 108*  BUN 7  CREATININE 0.59  CALCIUM 8.3*   PT/INR No results found for this basename: LABPROT, INR,  in the last 72 hours ABG No results found for this basename: PHART, PCO2, PO2, HCO3,  in the last 72 hours  Studies/Results: No results found.  Anti-infectives: Anti-infectives   Start     Dose/Rate Route Frequency Ordered Stop   03/14/13 0730  ceFAZolin (ANCEF) IVPB 2 g/50 mL premix     2 g 100 mL/hr over 30 Minutes Intravenous  Once 03/14/13 0723 03/14/13 0921   03/14/13 0725  ceFAZolin (ANCEF) 2-3 GM-% IVPB SOLR    Comments:  Ardine Eng   : cabinet override      03/14/13 0725 03/14/13 1929      Assessment/Plan: s/p Procedure(s): aneurysm  embolization   (N/A)  L carotid  artery aneurysm pipeline stent placed 1/7 Doing well Will report to Dr Estanislado Pandy Plan for dc today Recheck groin in 2-3 hrs Dc foley Advance diet  LOS: 1 day    Radiance Deady A 03/15/2013

## 2013-03-18 ENCOUNTER — Telehealth (HOSPITAL_COMMUNITY): Payer: Self-pay | Admitting: Radiology

## 2013-03-18 NOTE — Telephone Encounter (Signed)
Note continued from phone note. pt called to IR for headache and nausea and vomiting times1.  pt states head ache is 6/10 on the same side as the procedure on wednesday (pipeline). denies current nausea, dizziness, numbness, or vomiting currently. No visual changes noted. Pt states her friend Dr. [place] Crane has examined her neurologically and there are no deficits at this time.  Information forwarded to Dr. Estanislado Pandy with the response of pt may take ibuprophen for headache symptoms as well as clear liquid diet for nausea. If symptoms progress or do not get better she can come to ER for workup and CT scan. Pt verbalizes understanding and asks if she can take hydrocodone and phenergan that she has at home. Reiterated specific instructions to her regarding ibuprophen and clear liquid diet.  She verbalizes understanding that she should come to ER with any progression of symptoms.

## 2013-03-19 ENCOUNTER — Telehealth (HOSPITAL_COMMUNITY): Payer: Self-pay | Admitting: Interventional Radiology

## 2013-03-19 ENCOUNTER — Encounter (HOSPITAL_COMMUNITY): Payer: Self-pay | Admitting: Emergency Medicine

## 2013-03-19 ENCOUNTER — Emergency Department (HOSPITAL_COMMUNITY)
Admission: EM | Admit: 2013-03-19 | Discharge: 2013-03-19 | Disposition: A | Discharge status: Home / Self Care | Payer: BC Managed Care – PPO | Attending: Emergency Medicine | Admitting: Emergency Medicine

## 2013-03-19 ENCOUNTER — Emergency Department (HOSPITAL_COMMUNITY): Payer: BC Managed Care – PPO

## 2013-03-19 DIAGNOSIS — Z8679 Personal history of other diseases of the circulatory system: Secondary | ICD-10-CM | POA: Insufficient documentation

## 2013-03-19 DIAGNOSIS — J45909 Unspecified asthma, uncomplicated: Secondary | ICD-10-CM | POA: Insufficient documentation

## 2013-03-19 DIAGNOSIS — R519 Headache, unspecified: Secondary | ICD-10-CM

## 2013-03-19 DIAGNOSIS — Z7982 Long term (current) use of aspirin: Secondary | ICD-10-CM | POA: Insufficient documentation

## 2013-03-19 DIAGNOSIS — Z8719 Personal history of other diseases of the digestive system: Secondary | ICD-10-CM | POA: Insufficient documentation

## 2013-03-19 DIAGNOSIS — R51 Headache: Secondary | ICD-10-CM | POA: Insufficient documentation

## 2013-03-19 DIAGNOSIS — Z9889 Other specified postprocedural states: Secondary | ICD-10-CM | POA: Insufficient documentation

## 2013-03-19 DIAGNOSIS — Z7902 Long term (current) use of antithrombotics/antiplatelets: Secondary | ICD-10-CM | POA: Insufficient documentation

## 2013-03-19 DIAGNOSIS — R7401 Elevation of levels of liver transaminase levels: Secondary | ICD-10-CM | POA: Insufficient documentation

## 2013-03-19 DIAGNOSIS — R112 Nausea with vomiting, unspecified: Secondary | ICD-10-CM

## 2013-03-19 DIAGNOSIS — R7402 Elevation of levels of lactic acid dehydrogenase (LDH): Secondary | ICD-10-CM | POA: Insufficient documentation

## 2013-03-19 DIAGNOSIS — Z87891 Personal history of nicotine dependence: Secondary | ICD-10-CM | POA: Insufficient documentation

## 2013-03-19 DIAGNOSIS — R740 Nonspecific elevation of levels of transaminase and lactic acid dehydrogenase [LDH]: Secondary | ICD-10-CM

## 2013-03-19 LAB — CBC WITH DIFFERENTIAL/PLATELET
BASOS PCT: 0 % (ref 0–1)
Basophils Absolute: 0 10*3/uL (ref 0.0–0.1)
Eosinophils Absolute: 0.2 10*3/uL (ref 0.0–0.7)
Eosinophils Relative: 2 % (ref 0–5)
HEMATOCRIT: 36.8 % (ref 36.0–46.0)
Hemoglobin: 12.6 g/dL (ref 12.0–15.0)
Lymphocytes Relative: 31 % (ref 12–46)
Lymphs Abs: 2.1 10*3/uL (ref 0.7–4.0)
MCH: 30.1 pg (ref 26.0–34.0)
MCHC: 34.2 g/dL (ref 30.0–36.0)
MCV: 88 fL (ref 78.0–100.0)
MONO ABS: 0.6 10*3/uL (ref 0.1–1.0)
MONOS PCT: 8 % (ref 3–12)
Neutro Abs: 4 10*3/uL (ref 1.7–7.7)
Neutrophils Relative %: 58 % (ref 43–77)
Platelets: 315 10*3/uL (ref 150–400)
RBC: 4.18 MIL/uL (ref 3.87–5.11)
RDW: 12.8 % (ref 11.5–15.5)
WBC: 6.9 10*3/uL (ref 4.0–10.5)

## 2013-03-19 LAB — COMPREHENSIVE METABOLIC PANEL
ALT: 87 U/L — ABNORMAL HIGH (ref 0–35)
AST: 64 U/L — ABNORMAL HIGH (ref 0–37)
Albumin: 4.3 g/dL (ref 3.5–5.2)
Alkaline Phosphatase: 50 U/L (ref 39–117)
BILIRUBIN TOTAL: 0.5 mg/dL (ref 0.3–1.2)
BUN: 8 mg/dL (ref 6–23)
CO2: 21 mEq/L (ref 19–32)
CREATININE: 0.74 mg/dL (ref 0.50–1.10)
Calcium: 9.5 mg/dL (ref 8.4–10.5)
Chloride: 100 mEq/L (ref 96–112)
GFR calc Af Amer: >90 mL/min (ref >90)
GFR calc non Af Amer: 90 mL/min — ABNORMAL LOW (ref >90)
Glucose, Bld: 114 mg/dL — ABNORMAL HIGH (ref 70–99)
Potassium: 3.8 mEq/L (ref 3.7–5.3)
Sodium: 135 mEq/L — ABNORMAL LOW (ref 137–147)
TOTAL PROTEIN: 7.5 g/dL (ref 6.0–8.3)

## 2013-03-19 MED ORDER — SODIUM CHLORIDE 0.9 % IV SOLN
1000.0000 mL | Freq: Once | INTRAVENOUS | Status: AC
  Administered 2013-03-19: 1000 mL via INTRAVENOUS

## 2013-03-19 MED ORDER — METOCLOPRAMIDE HCL 5 MG/ML IJ SOLN
10.0000 mg | Freq: Once | INTRAMUSCULAR | Status: AC
  Administered 2013-03-19: 10 mg via INTRAVENOUS
  Filled 2013-03-19: qty 2

## 2013-03-19 MED ORDER — SODIUM CHLORIDE 0.9 % IV SOLN
Freq: Once | INTRAVENOUS | Status: AC
  Administered 2013-03-19: 03:00:00 via INTRAVENOUS

## 2013-03-19 MED ORDER — SODIUM CHLORIDE 0.9 % IV SOLN
1000.0000 mL | INTRAVENOUS | Status: DC
  Administered 2013-03-19: 1000 mL via INTRAVENOUS

## 2013-03-19 MED ORDER — ONDANSETRON HCL 4 MG/2ML IJ SOLN
4.0000 mg | Freq: Once | INTRAMUSCULAR | Status: AC
  Administered 2013-03-19: 4 mg via INTRAVENOUS
  Filled 2013-03-19: qty 2

## 2013-03-19 MED ORDER — METOCLOPRAMIDE HCL 10 MG PO TABS: 10.0000 mg | ORAL_TABLET | Freq: Four times a day (QID) | ORAL | Status: DC | PRN

## 2013-03-19 MED ORDER — DIPHENHYDRAMINE HCL 50 MG/ML IJ SOLN
25.0000 mg | Freq: Once | INTRAMUSCULAR | Status: AC
  Administered 2013-03-19: 25 mg via INTRAVENOUS
  Filled 2013-03-19: qty 1
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
## 2013-03-19 NOTE — ED Notes (Signed)
Pt to CT

## 2013-03-19 NOTE — ED Notes (Addendum)
Pt. reports intermittent nausea , vomitting with headache onset this morning , denies diarrhea , no fever or chills. No emesis at triage .

## 2013-03-19 NOTE — Discharge Instructions (Signed)
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 However, your blood tests did show an elevation of liver tests-ALT and AST. These levels should be rechecked in one to 2 weeks to make sure that they're not getting worse. You or her PCP can arrange for the blood work to be done.  Nausea and Vomiting Nausea is a sick feeling that often comes before throwing up (vomiting). Vomiting is a reflex where stomach contents come out of your mouth. Vomiting can cause severe loss of body fluids (dehydration). Children and elderly adults can become dehydrated quickly, especially if they also have diarrhea. Nausea and vomiting are symptoms of a condition or disease. It is important to find the cause of your symptoms. CAUSES   Direct irritation of the stomach lining. This irritation can result from increased acid production (gastroesophageal reflux disease), infection, food poisoning, taking certain medicines (such as nonsteroidal anti-inflammatory drugs), alcohol use, or tobacco use.  Signals from the brain.These signals could be caused by a headache, heat exposure, an inner ear disturbance, increased pressure in the brain from injury, infection, a tumor, or a concussion, pain, emotional stimulus, or metabolic problems.  An obstruction in the gastrointestinal tract (bowel obstruction).  Illnesses such as diabetes, hepatitis, gallbladder problems, appendicitis, kidney problems, cancer, sepsis, atypical symptoms of a heart attack, or eating disorders.  Medical treatments such as chemotherapy and radiation.  Receiving medicine that makes you sleep (general anesthetic) during surgery. DIAGNOSIS Your caregiver may ask for tests to be done if the problems do not improve after a few days. Tests may also be done if symptoms are severe or if the reason for the nausea and vomiting is not clear. Tests may include:  Urine tests.  Blood tests.  Stool tests.  Cultures (to look for evidence of  infection).  X-rays or other imaging studies. Test results can help your caregiver make decisions about treatment or the need for additional tests. TREATMENT You need to stay well hydrated. Drink frequently but in small amounts.You may wish to drink water, sports drinks, clear broth, or eat frozen ice pops or gelatin dessert to help stay hydrated.When you eat, eating slowly may help prevent nausea.There are also some antinausea medicines that may help prevent nausea. HOME CARE INSTRUCTIONS   Take all medicine as directed by your caregiver.  If you do not have an appetite, do not force yourself to eat. However, you must continue to drink fluids.  If you have an appetite, eat a normal diet unless your caregiver tells you differently.  Eat a variety of complex carbohydrates (rice, wheat, potatoes, bread), lean meats, yogurt, fruits, and vegetables.  Avoid high-fat foods because they are more difficult to digest.  Drink enough water and fluids to keep your urine clear or pale yellow.  If you are dehydrated, ask your caregiver for specific rehydration instructions. Signs of dehydration may include:  Severe thirst.  Dry lips and mouth.  Dizziness.  Dark urine.  Decreasing urine frequency and amount.  Confusion.  Rapid breathing or pulse. SEEK IMMEDIATE MEDICAL CARE IF:   You have blood or brown flecks (like coffee grounds) in your vomit.  You have black or bloody stools.  You have a severe headache or stiff neck.  You are confused.  You have severe abdominal pain.  You have chest pain or trouble breathing.  You do not urinate at least once every 8 hours.  You develop cold or clammy skin.  You continue to vomit for longer than  24 to 48 hours.  You have a fever. MAKE SURE YOU:   Understand these instructions.  Will watch your condition.  Will get help right away if you are not doing well or get worse. Document Released: 02/22/2005 Document Revised: 05/17/2011  Document Reviewed: 07/22/2010 [facility] Patient Information 2014 [place], [place].  Metoclopramide tablets What is this medicine? METOCLOPRAMIDE (met oh kloe PRA mide) is used to treat the symptoms of gastroesophageal reflux disease (GERD) like heartburn. It is also used to treat people with slow emptying of the stomach and intestinal tract. This medicine may be used for other purposes; ask your health care provider or pharmacist if you have questions. COMMON BRAND NAME(S): Reglan What should I tell my health care provider before I take this medicine? They need to know if you have any of these conditions: -breast cancer -depression -diabetes -heart failure -high blood pressure -kidney disease -liver disease -Parkinson's disease or a movement disorder -pheochromocytoma -seizures -stomach obstruction, bleeding, or perforation -an unusual or allergic reaction to metoclopramide, procainamide, sulfites, other medicines, foods, dyes, or preservatives -pregnant or trying to get pregnant -breast-feeding How should I use this medicine? Take this medicine by mouth with a glass of water. Follow the directions on the prescription label. Take this medicine on an empty stomach, about 30 minutes before eating. Take your doses at regular intervals. Do not take your medicine more often than directed. Do not stop taking except on the advice of your doctor or health care professional. A special MedGuide will be given to you by the pharmacist with each prescription and refill. Be sure to read this information carefully each time. Talk to your pediatrician regarding the use of this medicine in children. Special care may be needed. Overdosage: If you think you have taken too much of this medicine contact a poison control center or emergency room at once. NOTE: This medicine is only for you. Do not share this medicine with others. What if I miss a dose? If you miss a dose, take it as soon as you can. If it is  almost time for your next dose, take only that dose. Do not take double or extra doses. What may interact with this medicine? -acetaminophen -cyclosporine -digoxin -medicines for blood pressure -medicines for diabetes, including insulin -medicines for hay fever and other allergies -medicines for depression, especially an Monoamine Oxidase Inhibitor (MAOI) -medicines for Parkinson's disease, like levodopa -medicines for sleep or for pain -tetracycline This list may not describe all possible interactions. Give your health care provider a list of all the medicines, herbs, non-prescription drugs, or dietary supplements you use. Also tell them if you smoke, drink alcohol, or use illegal drugs. Some items may interact with your medicine. What should I watch for while using this medicine? It may take a few weeks for your stomach condition to start to get better. However, do not take this medicine for longer than 12 weeks. The longer you take this medicine, and the more you take it, the greater your chances are of developing serious side effects. If you are an elderly patient, a female patient, or you have diabetes, you may be at an increased risk for side effects from this medicine. Contact your doctor immediately if you start having movements you cannot control such as lip smacking, rapid movements of the tongue, involuntary or uncontrollable movements of the eyes, head, arms and legs, or muscle twitches and spasms. Patients and their families should watch out for worsening depression or thoughts of suicide. Also  watch out for any sudden or severe changes in feelings such as feeling anxious, agitated, panicky, irritable, hostile, aggressive, impulsive, severely restless, overly excited and hyperactive, or not being able to sleep. If this happens, especially at the beginning of treatment or after a change in dose, call your doctor. Do not treat yourself for high fever. Ask your doctor or health care  professional for advice. You may get drowsy or dizzy. Do not drive, use machinery, or do anything that needs mental alertness until you know how this drug affects you. Do not stand or sit up quickly, especially if you are an older patient. This reduces the risk of dizzy or fainting spells. Alcohol can make you more drowsy and dizzy. Avoid alcoholic drinks. What side effects may I notice from receiving this medicine? Side effects that you should report to your doctor or health care professional as soon as possible: -allergic reactions like skin rash, itching or hives, swelling of the face, lips, or tongue -abnormal production of milk in females -breast enlargement in both males and females -change in the way you walk -difficulty moving, speaking or swallowing -drooling, lip smacking, or rapid movements of the tongue -excessive sweating -fever -involuntary or uncontrollable movements of the eyes, head, arms and legs -irregular heartbeat or palpitations -muscle twitches and spasms -unusually weak or tired Side effects that usually do not require medical attention (report to your doctor or health care professional if they continue or are bothersome): -change in sex drive or performance -depressed mood -diarrhea -difficulty sleeping -headache -menstrual changes -restless or nervous This list may not describe all possible side effects. Call your doctor for medical advice about side effects. You may report side effects to FDA at 1-800-FDA-1088. Where should I keep my medicine? Keep out of the reach of children. Store at room temperature between 20 and 25 degrees C (68 and 77 degrees F). Protect from light. Keep container tightly closed. Throw away any unused medicine after the expiration date. NOTE: This sheet is a summary. It may not cover all possible information. If you have questions about this medicine, talk to your doctor, pharmacist, or health care provider.  2014, [employer]. (2011-06-22 13:04:38)

## 2013-03-28 ENCOUNTER — Ambulatory Visit (HOSPITAL_COMMUNITY): Admission: RE | Admit: 2013-03-28 | Payer: BC Managed Care – PPO | Source: Ambulatory Visit

## 2013-03-29 ENCOUNTER — Ambulatory Visit (HOSPITAL_COMMUNITY)
Admission: RE | Admit: 2013-03-29 | Discharge: 2013-03-29 | Disposition: A | Discharge status: Home / Self Care | Payer: BC Managed Care – PPO | Source: Ambulatory Visit | Attending: Radiology | Admitting: Radiology

## 2013-03-29 ENCOUNTER — Telehealth (HOSPITAL_COMMUNITY): Payer: Self-pay | Admitting: Interventional Radiology

## 2013-03-29 DIAGNOSIS — R51 Headache: Secondary | ICD-10-CM | POA: Insufficient documentation

## 2013-03-29 DIAGNOSIS — I671 Cerebral aneurysm, nonruptured: Secondary | ICD-10-CM | POA: Insufficient documentation

## 2013-03-29 DIAGNOSIS — Z9889 Other specified postprocedural states: Secondary | ICD-10-CM | POA: Insufficient documentation

## 2013-03-29 DIAGNOSIS — Z09 Encounter for follow-up examination after completed treatment for conditions other than malignant neoplasm: Secondary | ICD-10-CM | POA: Insufficient documentation

## 2013-03-29 LAB — COMPREHENSIVE METABOLIC PANEL
ALBUMIN: 3.9 g/dL (ref 3.5–5.2)
ALT: 16 U/L (ref 0–35)
AST: 11 U/L (ref 0–37)
Alkaline Phosphatase: 55 U/L (ref 39–117)
BUN: 9 mg/dL (ref 6–23)
CALCIUM: 9.1 mg/dL (ref 8.4–10.5)
CO2: 24 meq/L (ref 19–32)
Chloride: 99 mEq/L (ref 96–112)
Creatinine, Ser: 0.65 mg/dL (ref 0.50–1.10)
GFR calc Af Amer: >90 mL/min (ref >90)
GFR calc non Af Amer: >90 mL/min (ref >90)
Glucose, Bld: 116 mg/dL — ABNORMAL HIGH (ref 70–99)
Potassium: 4 mEq/L (ref 3.7–5.3)
SODIUM: 136 meq/L — AB (ref 137–147)
Total Bilirubin: 0.3 mg/dL (ref 0.3–1.2)
Total Protein: 7.3 g/dL (ref 6.0–8.3)

## 2013-03-29 LAB — PLATELET INHIBITION P2Y12: PLATELET FUNCTION P2Y12: 0 [PRU] — AB (ref 194–418)

## 2013-03-29 NOTE — Telephone Encounter (Signed)
Called pt told her to start alternating her Plavix 75mg  1/2 tablet tomorrow (Friday, 03/30/13) and 1 full tablet every other day, continue on her aspirin 81mg  q d, and recheck a P2Y12 in 1 week. Pt states understanding and is in agreement with this plan of care. JM

## 2013-04-03 ENCOUNTER — Other Ambulatory Visit: Payer: Self-pay | Admitting: Radiology

## 2013-04-05 LAB — PLATELET INHIBITION P2Y12: Platelet Function  P2Y12: 2 [PRU] — ABNORMAL LOW (ref 194–418)

## 2013-04-06 ENCOUNTER — Other Ambulatory Visit: Payer: Self-pay | Admitting: Radiology

## 2013-04-06 ENCOUNTER — Telehealth (HOSPITAL_COMMUNITY): Payer: Self-pay | Admitting: Interventional Radiology

## 2013-04-06 NOTE — Telephone Encounter (Signed)
Called pt with her P2Y12 results. Told her that Dr. Estanislado Pandy wants her to take Plavix 75mg  1/2 tablet per day and we would recheck her labs again on 04/16/13. Pt agrees with plan of care and states understanding. JM

## 2013-04-16 ENCOUNTER — Other Ambulatory Visit: Payer: Self-pay | Admitting: Radiology

## 2013-04-16 ENCOUNTER — Telehealth: Payer: Self-pay | Admitting: Radiology

## 2013-04-16 LAB — PLATELET INHIBITION P2Y12: PLATELET FUNCTION P2Y12: 1 [PRU] — AB (ref 194–418)

## 2013-04-16 NOTE — Progress Notes (Signed)
Patient had a P2Y12 today which was 1. These results were reviewed with Dr. Estanislado Pandy and I have called the patient. New instructions plavix 75mg  1/2 tablet every other day and aspirin 81mg  daily. The patient will return in 7-10 days for a P2Y12. She states understanding of this plan.  Tsosie Billing PA-C Interventional Radiology  04/16/2013 1:51 PM

## 2013-04-17 ENCOUNTER — Other Ambulatory Visit: Payer: Self-pay | Admitting: Radiology

## 2013-04-25 LAB — PLATELET INHIBITION P2Y12: PLATELET FUNCTION P2Y12: 14 [PRU] — AB (ref 194–418)

## 2013-04-27 ENCOUNTER — Telehealth (HOSPITAL_COMMUNITY): Payer: Self-pay | Admitting: Interventional Radiology

## 2013-04-27 NOTE — Telephone Encounter (Signed)
Called pt to tell her that per Dr. Estanislado Pandy she should continue on her current regimen of Plavix 75mg  q.o.d.  She states understanding and is agreement with this plan of care. JM

## 2013-05-22 ENCOUNTER — Other Ambulatory Visit: Payer: Self-pay | Admitting: Radiology

## 2013-05-23 ENCOUNTER — Other Ambulatory Visit: Payer: Self-pay | Admitting: Radiology

## 2013-05-24 ENCOUNTER — Other Ambulatory Visit: Payer: Self-pay | Admitting: Radiology

## 2013-05-25 LAB — PLATELET INHIBITION P2Y12: Platelet Function  P2Y12: 1 [PRU] — ABNORMAL LOW (ref 194–418)

## 2013-05-29 ENCOUNTER — Telehealth (HOSPITAL_COMMUNITY): Payer: Self-pay | Admitting: Interventional Radiology

## 2013-05-29 NOTE — Telephone Encounter (Signed)
Called pt told her per Deveshwar to change her Plavix dose from 75mg  1 full tablet every other day to 75mg  1/2 tablet every other day. Also told pt that she is now due for a 3 month f/u MRI/MRA and I would contact her once her insurance approved study. Pt states understanding and is in agreement with this plan of care JM

## 2013-05-30 ENCOUNTER — Telehealth (HOSPITAL_COMMUNITY): Payer: Self-pay | Admitting: Interventional Radiology

## 2013-05-30 ENCOUNTER — Other Ambulatory Visit: Payer: Self-pay | Admitting: Radiology

## 2013-05-30 LAB — PLATELET INHIBITION P2Y12: Platelet Function  P2Y12: 6 [PRU] — ABNORMAL LOW (ref 194–418)

## 2013-05-30 NOTE — Telephone Encounter (Signed)
Called pt, told her that [facility] wants her to come back in today and have another P2Y12 test due to her recent results. Pt agrees to come in and have this done today. JM

## 2013-06-06 ENCOUNTER — Other Ambulatory Visit (HOSPITAL_COMMUNITY): Payer: Self-pay | Admitting: Interventional Radiology

## 2013-06-06 DIAGNOSIS — I729 Aneurysm of unspecified site: Secondary | ICD-10-CM

## 2013-06-11 ENCOUNTER — Ambulatory Visit (HOSPITAL_COMMUNITY)
Admission: RE | Admit: 2013-06-11 | Discharge: 2013-06-11 | Disposition: A | Discharge status: Home / Self Care | Payer: BC Managed Care – PPO | Source: Ambulatory Visit | Attending: Interventional Radiology | Admitting: Interventional Radiology

## 2013-06-11 DIAGNOSIS — I729 Aneurysm of unspecified site: Secondary | ICD-10-CM

## 2013-06-11 DIAGNOSIS — I671 Cerebral aneurysm, nonruptured: Secondary | ICD-10-CM | POA: Insufficient documentation

## 2013-06-11 LAB — CREATININE, SERUM
CREATININE: 0.84 mg/dL (ref 0.50–1.10)
GFR calc Af Amer: 85 mL/min — ABNORMAL LOW (ref >90)
GFR, EST NON AFRICAN AMERICAN: 74 mL/min — AB (ref >90)

## 2013-06-11 MED ORDER — GADOBENATE DIMEGLUMINE 529 MG/ML IV SOLN
13.0000 mL | Freq: Once | INTRAVENOUS | Status: AC | PRN
  Administered 2013-06-11: 13 mL via INTRAVENOUS

## 2013-06-12 ENCOUNTER — Other Ambulatory Visit (HOSPITAL_COMMUNITY): Payer: Self-pay | Admitting: Interventional Radiology

## 2013-06-12 DIAGNOSIS — I729 Aneurysm of unspecified site: Secondary | ICD-10-CM

## 2013-06-18 ENCOUNTER — Telehealth (HOSPITAL_COMMUNITY): Payer: Self-pay | Admitting: Interventional Radiology

## 2013-06-18 ENCOUNTER — Ambulatory Visit (HOSPITAL_COMMUNITY)
Admission: RE | Admit: 2013-06-18 | Discharge: 2013-06-18 | Disposition: A | Discharge status: Home / Self Care | Payer: BC Managed Care – PPO | Source: Ambulatory Visit | Attending: Interventional Radiology | Admitting: Interventional Radiology

## 2013-06-18 DIAGNOSIS — I671 Cerebral aneurysm, nonruptured: Secondary | ICD-10-CM | POA: Insufficient documentation

## 2013-06-18 DIAGNOSIS — Z9889 Other specified postprocedural states: Secondary | ICD-10-CM | POA: Insufficient documentation

## 2013-06-18 DIAGNOSIS — I729 Aneurysm of unspecified site: Secondary | ICD-10-CM

## 2013-06-18 DIAGNOSIS — R51 Headache: Secondary | ICD-10-CM | POA: Insufficient documentation

## 2013-06-18 LAB — PLATELET INHIBITION P2Y12: Platelet Function  P2Y12: 21 [PRU] — ABNORMAL LOW (ref 194–418)

## 2013-06-18 NOTE — Telephone Encounter (Signed)
Called pt, no answer. Will call back JM

## 2013-07-02 LAB — PLATELET INHIBITION P2Y12: PLATELET FUNCTION P2Y12: 8 [PRU] — AB (ref 194–418)

## 2013-08-07 ENCOUNTER — Other Ambulatory Visit: Payer: Self-pay | Admitting: Radiology

## 2013-08-07 LAB — PLATELET INHIBITION P2Y12: Platelet Function  P2Y12: 152 [PRU] — ABNORMAL LOW (ref 194–418)

## 2013-08-24 ENCOUNTER — Telehealth (HOSPITAL_COMMUNITY): Payer: Self-pay | Admitting: Interventional Radiology

## 2013-09-04 ENCOUNTER — Telehealth (HOSPITAL_COMMUNITY): Payer: Self-pay | Admitting: Interventional Radiology

## 2013-09-04 NOTE — Telephone Encounter (Signed)
Called pt and told her to come by Thursday, September 06, 2013 for a recheck P2Y12. She states she will come by on 09/06/13. JMichaux

## 2013-09-06 LAB — PLATELET INHIBITION P2Y12: Platelet Function  P2Y12: 111 [PRU] — ABNORMAL LOW (ref 194–418)

## 2013-10-03 ENCOUNTER — Other Ambulatory Visit (HOSPITAL_COMMUNITY): Payer: Self-pay | Admitting: Interventional Radiology

## 2013-10-03 DIAGNOSIS — I729 Aneurysm of unspecified site: Secondary | ICD-10-CM

## 2013-10-04 ENCOUNTER — Telehealth (HOSPITAL_COMMUNITY): Payer: Self-pay | Admitting: Interventional Radiology

## 2013-10-04 NOTE — Telephone Encounter (Signed)
Called pt, told her it was time to schedule her 3 month f/u cath angio. She states she is out of town right now and will call me on Monday 10/08/13 to schedule. JM

## 2013-10-23 ENCOUNTER — Other Ambulatory Visit (HOSPITAL_COMMUNITY): Payer: Self-pay | Admitting: Interventional Radiology

## 2013-10-23 DIAGNOSIS — I671 Cerebral aneurysm, nonruptured: Secondary | ICD-10-CM

## 2013-11-19 ENCOUNTER — Ambulatory Visit (HOSPITAL_COMMUNITY): Admission: RE | Admit: 2013-11-19 | Payer: 59 | Source: Ambulatory Visit

## 2013-11-19 ENCOUNTER — Ambulatory Visit (HOSPITAL_COMMUNITY)
Admission: RE | Admit: 2013-11-19 | Discharge: 2013-11-19 | Disposition: A | Discharge status: Home / Self Care | Payer: 59 | Source: Ambulatory Visit | Attending: Interventional Radiology | Admitting: Interventional Radiology

## 2013-11-19 DIAGNOSIS — I671 Cerebral aneurysm, nonruptured: Secondary | ICD-10-CM | POA: Diagnosis present

## 2013-11-19 LAB — CREATININE, SERUM
CREATININE: 0.77 mg/dL (ref 0.50–1.10)
GFR, EST NON AFRICAN AMERICAN: 89 mL/min — AB (ref >90)

## 2013-11-19 MED ORDER — GADOBENATE DIMEGLUMINE 529 MG/ML IV SOLN
13.0000 mL | Freq: Once | INTRAVENOUS | Status: AC | PRN
  Administered 2013-11-19: 13 mL via INTRAVENOUS

## 2014-01-09 ENCOUNTER — Telehealth (HOSPITAL_COMMUNITY): Payer: Self-pay | Admitting: Interventional Radiology

## 2014-01-09 NOTE — Telephone Encounter (Signed)
Called pt, spoke to her about her last MRI study. Pt had a few questions. I told her I would speak to Deveshwar and call her back. She was in agreement with this plan JM

## 2014-01-21 ENCOUNTER — Other Ambulatory Visit (HOSPITAL_COMMUNITY): Payer: Self-pay | Admitting: Interventional Radiology

## 2014-01-21 DIAGNOSIS — I729 Aneurysm of unspecified site: Secondary | ICD-10-CM

## 2014-01-25 ENCOUNTER — Ambulatory Visit (HOSPITAL_COMMUNITY)
Admission: RE | Admit: 2014-01-25 | Discharge: 2014-01-25 | Disposition: A | Discharge status: Home / Self Care | Payer: 59 | Source: Ambulatory Visit | Attending: Interventional Radiology | Admitting: Interventional Radiology

## 2014-01-25 DIAGNOSIS — I729 Aneurysm of unspecified site: Secondary | ICD-10-CM

## 2014-01-28 ENCOUNTER — Other Ambulatory Visit: Payer: Self-pay | Admitting: Radiology

## 2014-01-28 ENCOUNTER — Other Ambulatory Visit (HOSPITAL_COMMUNITY): Payer: Self-pay | Admitting: Interventional Radiology

## 2014-01-28 LAB — T4: T4, Total: 6.2 ug/dL (ref 4.5–12.0)

## 2014-01-28 LAB — T3: T3 TOTAL: 84.3 ng/dL (ref 80.0–204.0)

## 2014-01-28 LAB — PLATELET INHIBITION P2Y12: Platelet Function  P2Y12: 139 [PRU] — ABNORMAL LOW (ref 194–418)

## 2014-01-28 LAB — TSH: TSH: 4.41 u[IU]/mL (ref 0.350–4.500)

## 2014-01-28 LAB — T4, FREE: Free T4: 1.05 ng/dL (ref 0.80–1.80)

## 2014-01-30 LAB — T3, REVERSE: T3, Reverse: 13 ng/dL (ref 8–25)

## 2014-03-19 ENCOUNTER — Other Ambulatory Visit (HOSPITAL_COMMUNITY): Payer: Self-pay | Admitting: Internal Medicine

## 2014-03-19 DIAGNOSIS — Z1231 Encounter for screening mammogram for malignant neoplasm of breast: Secondary | ICD-10-CM

## 2014-03-25 ENCOUNTER — Ambulatory Visit (HOSPITAL_COMMUNITY)
Admission: RE | Admit: 2014-03-25 | Discharge: 2014-03-25 | Disposition: A | Discharge status: Home / Self Care | Payer: 59 | Source: Ambulatory Visit | Attending: Internal Medicine | Admitting: Internal Medicine

## 2014-03-25 DIAGNOSIS — Z1231 Encounter for screening mammogram for malignant neoplasm of breast: Secondary | ICD-10-CM | POA: Insufficient documentation

## 2014-05-09 ENCOUNTER — Other Ambulatory Visit (HOSPITAL_COMMUNITY): Payer: Self-pay | Admitting: Interventional Radiology

## 2014-05-09 DIAGNOSIS — I671 Cerebral aneurysm, nonruptured: Secondary | ICD-10-CM

## 2014-05-10 ENCOUNTER — Ambulatory Visit (HOSPITAL_COMMUNITY)
Admission: RE | Admit: 2014-05-10 | Discharge: 2014-05-10 | Disposition: A | Discharge status: Home / Self Care | Payer: 59 | Source: Ambulatory Visit | Attending: Interventional Radiology | Admitting: Interventional Radiology

## 2014-10-02 ENCOUNTER — Other Ambulatory Visit: Payer: Self-pay | Admitting: Physician Assistant

## 2014-11-08 ENCOUNTER — Other Ambulatory Visit: Payer: Self-pay | Admitting: Internal Medicine

## 2014-11-08 DIAGNOSIS — C6990 Malignant neoplasm of unspecified site of unspecified eye: Secondary | ICD-10-CM

## 2014-11-14 ENCOUNTER — Other Ambulatory Visit: Payer: Self-pay | Admitting: Internal Medicine

## 2014-11-14 ENCOUNTER — Ambulatory Visit (HOSPITAL_COMMUNITY): Payer: 59

## 2014-11-14 DIAGNOSIS — C799 Secondary malignant neoplasm of unspecified site: Secondary | ICD-10-CM

## 2014-11-14 DIAGNOSIS — C6990 Malignant neoplasm of unspecified site of unspecified eye: Secondary | ICD-10-CM

## 2014-11-15 ENCOUNTER — Ambulatory Visit
Admission: RE | Admit: 2014-11-15 | Discharge: 2014-11-15 | Disposition: A | Discharge status: Home / Self Care | Payer: 59 | Source: Ambulatory Visit | Attending: Internal Medicine | Admitting: Internal Medicine

## 2014-11-15 ENCOUNTER — Other Ambulatory Visit: Payer: 59

## 2014-11-15 ENCOUNTER — Other Ambulatory Visit: Payer: Self-pay | Admitting: Internal Medicine

## 2014-11-15 DIAGNOSIS — C6990 Malignant neoplasm of unspecified site of unspecified eye: Secondary | ICD-10-CM

## 2014-11-15 DIAGNOSIS — C799 Secondary malignant neoplasm of unspecified site: Secondary | ICD-10-CM

## 2014-11-15 MED ORDER — IOPAMIDOL (ISOVUE-300) INJECTION 61%
100.0000 mL | Freq: Once | INTRAVENOUS | Status: AC | PRN
  Administered 2014-11-15: 100 mL via INTRAVENOUS

## 2014-11-18 ENCOUNTER — Ambulatory Visit (HOSPITAL_COMMUNITY)
Admission: RE | Admit: 2014-11-18 | Discharge: 2014-11-18 | Disposition: A | Discharge status: Home / Self Care | Payer: 59 | Source: Ambulatory Visit | Attending: Interventional Radiology | Admitting: Interventional Radiology

## 2014-11-18 ENCOUNTER — Ambulatory Visit (HOSPITAL_COMMUNITY): Admission: RE | Admit: 2014-11-18 | Payer: 59 | Source: Ambulatory Visit

## 2014-11-18 DIAGNOSIS — I671 Cerebral aneurysm, nonruptured: Secondary | ICD-10-CM | POA: Diagnosis not present

## 2014-11-18 MED ORDER — GADOBENATE DIMEGLUMINE 529 MG/ML IV SOLN
15.0000 mL | Freq: Once | INTRAVENOUS | Status: AC | PRN
  Administered 2014-11-18: 12 mL via INTRAVENOUS

## 2014-11-29 ENCOUNTER — Telehealth (HOSPITAL_COMMUNITY): Payer: Self-pay | Admitting: *Deleted

## 2014-11-29 NOTE — Telephone Encounter (Addendum)
Called and refilled prescription for Plavix 37.5mg  every other day, per direction of Dr. Estanislado Pandy

## 2014-12-03 ENCOUNTER — Telehealth (HOSPITAL_COMMUNITY): Payer: Self-pay | Admitting: *Deleted

## 2014-12-04 ENCOUNTER — Telehealth (HOSPITAL_COMMUNITY): Payer: Self-pay | Admitting: *Deleted

## 2014-12-04 NOTE — Telephone Encounter (Signed)
Returned call to pharmacy verified dose and frequency of Plavix

## 2014-12-24 ENCOUNTER — Other Ambulatory Visit (HOSPITAL_COMMUNITY): Payer: Self-pay | Admitting: Interventional Radiology

## 2014-12-24 DIAGNOSIS — I729 Aneurysm of unspecified site: Secondary | ICD-10-CM

## 2015-01-08 ENCOUNTER — Telehealth (HOSPITAL_COMMUNITY): Payer: Self-pay | Admitting: *Deleted

## 2015-01-08 ENCOUNTER — Ambulatory Visit (HOSPITAL_COMMUNITY)
Admission: RE | Admit: 2015-01-08 | Discharge: 2015-01-08 | Disposition: A | Discharge status: Home / Self Care | Payer: 59 | Source: Ambulatory Visit | Attending: Interventional Radiology | Admitting: Interventional Radiology

## 2015-01-08 DIAGNOSIS — I729 Aneurysm of unspecified site: Secondary | ICD-10-CM

## 2015-01-08 DIAGNOSIS — I720 Aneurysm of carotid artery: Secondary | ICD-10-CM | POA: Insufficient documentation

## 2015-01-08 DIAGNOSIS — H532 Diplopia: Secondary | ICD-10-CM | POA: Insufficient documentation

## 2015-01-08 DIAGNOSIS — Z7902 Long term (current) use of antithrombotics/antiplatelets: Secondary | ICD-10-CM | POA: Diagnosis not present

## 2015-01-08 DIAGNOSIS — Z7982 Long term (current) use of aspirin: Secondary | ICD-10-CM | POA: Diagnosis not present

## 2015-01-08 LAB — PLATELET INHIBITION P2Y12: PLATELET FUNCTION P2Y12: 8 [PRU] — AB (ref 194–418)

## 2015-01-08 NOTE — Telephone Encounter (Signed)
Called patient and instructed pt per Dr. Anette Guarneri direction to take Alexandra Dennis 81mg  every other day and Plavix 18.75mg  every third day.  Patient to return next week for repeat P2Y12..  Patient repeated instructions back to me

## 2015-01-20 ENCOUNTER — Other Ambulatory Visit: Payer: Self-pay | Admitting: Radiology

## 2015-01-20 LAB — PLATELET INHIBITION P2Y12: Platelet Function  P2Y12: 7 [PRU] — ABNORMAL LOW (ref 194–418)

## 2015-01-22 ENCOUNTER — Telehealth (HOSPITAL_COMMUNITY): Payer: Self-pay | Admitting: *Deleted

## 2015-01-22 NOTE — Telephone Encounter (Signed)
Called and spoke with patient regarding email sent to [facility] regarding medication regimen.  Per Dr. Estanislado Pandy instructions she is to take Plavix 18.75 mg every 3rd day and ASA  81mg  every other day.  Explained to patient that someday's she will not take anything and someday's she will be taking both medications and that Dr. Estanislado Pandy is aware and that is what he wants her to do.  She asked that [facility] email her when she needs to come have P2Y12 redrawn.

## 2015-04-29 ENCOUNTER — Telehealth (HOSPITAL_COMMUNITY): Payer: Self-pay

## 2015-04-29 NOTE — Telephone Encounter (Signed)
Called to schedule f/u MRI/MRA, left a message for pt to return call. AW

## 2015-04-30 ENCOUNTER — Other Ambulatory Visit (HOSPITAL_COMMUNITY): Payer: Self-pay | Admitting: Interventional Radiology

## 2015-04-30 DIAGNOSIS — I671 Cerebral aneurysm, nonruptured: Secondary | ICD-10-CM

## 2015-06-05 ENCOUNTER — Ambulatory Visit (HOSPITAL_COMMUNITY): Payer: 59

## 2015-06-05 ENCOUNTER — Ambulatory Visit (HOSPITAL_COMMUNITY)
Admission: RE | Admit: 2015-06-05 | Discharge: 2015-06-05 | Disposition: A | Discharge status: Home / Self Care | Payer: 59 | Source: Ambulatory Visit | Attending: Interventional Radiology | Admitting: Interventional Radiology

## 2015-06-05 DIAGNOSIS — I671 Cerebral aneurysm, nonruptured: Secondary | ICD-10-CM | POA: Diagnosis not present

## 2015-06-05 LAB — CREATININE, SERUM
CREATININE: 0.84 mg/dL (ref 0.44–1.00)
GFR calc Af Amer: >60 mL/min (ref >60)
GFR calc non Af Amer: >60 mL/min (ref >60)

## 2015-06-05 MED ORDER — GADOBENATE DIMEGLUMINE 529 MG/ML IV SOLN
13.0000 mL | Freq: Once | INTRAVENOUS | Status: AC | PRN
  Administered 2015-06-05: 13 mL via INTRAVENOUS

## 2015-06-23 ENCOUNTER — Other Ambulatory Visit (HOSPITAL_COMMUNITY): Payer: Self-pay | Admitting: Interventional Radiology

## 2015-06-23 DIAGNOSIS — I671 Cerebral aneurysm, nonruptured: Secondary | ICD-10-CM

## 2015-06-25 ENCOUNTER — Other Ambulatory Visit: Payer: Self-pay

## 2015-06-25 DIAGNOSIS — Z1231 Encounter for screening mammogram for malignant neoplasm of breast: Secondary | ICD-10-CM

## 2015-06-26 ENCOUNTER — Ambulatory Visit (HOSPITAL_COMMUNITY)
Admission: RE | Admit: 2015-06-26 | Discharge: 2015-06-26 | Disposition: A | Discharge status: Home / Self Care | Payer: 59 | Source: Ambulatory Visit | Attending: Interventional Radiology | Admitting: Interventional Radiology

## 2015-06-26 ENCOUNTER — Other Ambulatory Visit: Payer: Self-pay | Admitting: General Surgery

## 2015-06-26 DIAGNOSIS — I671 Cerebral aneurysm, nonruptured: Secondary | ICD-10-CM

## 2015-06-26 DIAGNOSIS — Z539 Procedure and treatment not carried out, unspecified reason: Secondary | ICD-10-CM | POA: Diagnosis not present

## 2015-06-26 LAB — PLATELET INHIBITION P2Y12: Platelet Function  P2Y12: 110 [PRU] — ABNORMAL LOW (ref 194–418)

## 2015-06-27 ENCOUNTER — Telehealth (HOSPITAL_COMMUNITY): Payer: Self-pay | Admitting: *Deleted

## 2015-06-27 NOTE — Telephone Encounter (Signed)
Called patient. Per Dr. Estanislado Pandy patient to stop Plavix and continue ASA 81mg  Every other day.  Come back in 2 weeks for p2y12

## 2015-06-27 NOTE — Telephone Encounter (Signed)
Called and verified medication and dose.  Will call patient back once Dr. Estanislado Pandy has reviewed lab results

## 2015-06-27 NOTE — Telephone Encounter (Signed)
Per Dr. Estanislado Pandy patient to stop taking Plavix and continue taking ASA 81mg  every other day. Pt to come back in 2 weeks for repeat P2Y12. Pt voiced understanding

## 2015-07-02 ENCOUNTER — Ambulatory Visit: Payer: 59

## 2015-07-18 ENCOUNTER — Ambulatory Visit
Admission: RE | Admit: 2015-07-18 | Discharge: 2015-07-18 | Disposition: A | Discharge status: Home / Self Care | Payer: 59 | Source: Ambulatory Visit

## 2015-07-18 DIAGNOSIS — Z1231 Encounter for screening mammogram for malignant neoplasm of breast: Secondary | ICD-10-CM

## 2015-08-14 ENCOUNTER — Other Ambulatory Visit: Payer: Self-pay | Admitting: Radiology

## 2015-08-14 LAB — PLATELET INHIBITION P2Y12: PLATELET FUNCTION P2Y12: 231 [PRU] (ref 194–418)

## 2015-08-22 ENCOUNTER — Telehealth (HOSPITAL_COMMUNITY): Payer: Self-pay | Admitting: *Deleted

## 2015-08-22 NOTE — Telephone Encounter (Signed)
Called pt and LM, to continue ASA 81mg  every other day.  To call us back for questions or concerns

## 2015-11-21 IMAGING — CR DG CHEST 2V
2 series · 2 of 2 positions shown · non-contrast
Comparison: Chest CT 11/15/2014.

CLINICAL DATA: Preoperative exam for ocular surgery. Former smoker
quit [id number]. Unknown pack-year history.

EXAM:
CHEST  2 VIEW

[w chest pa]
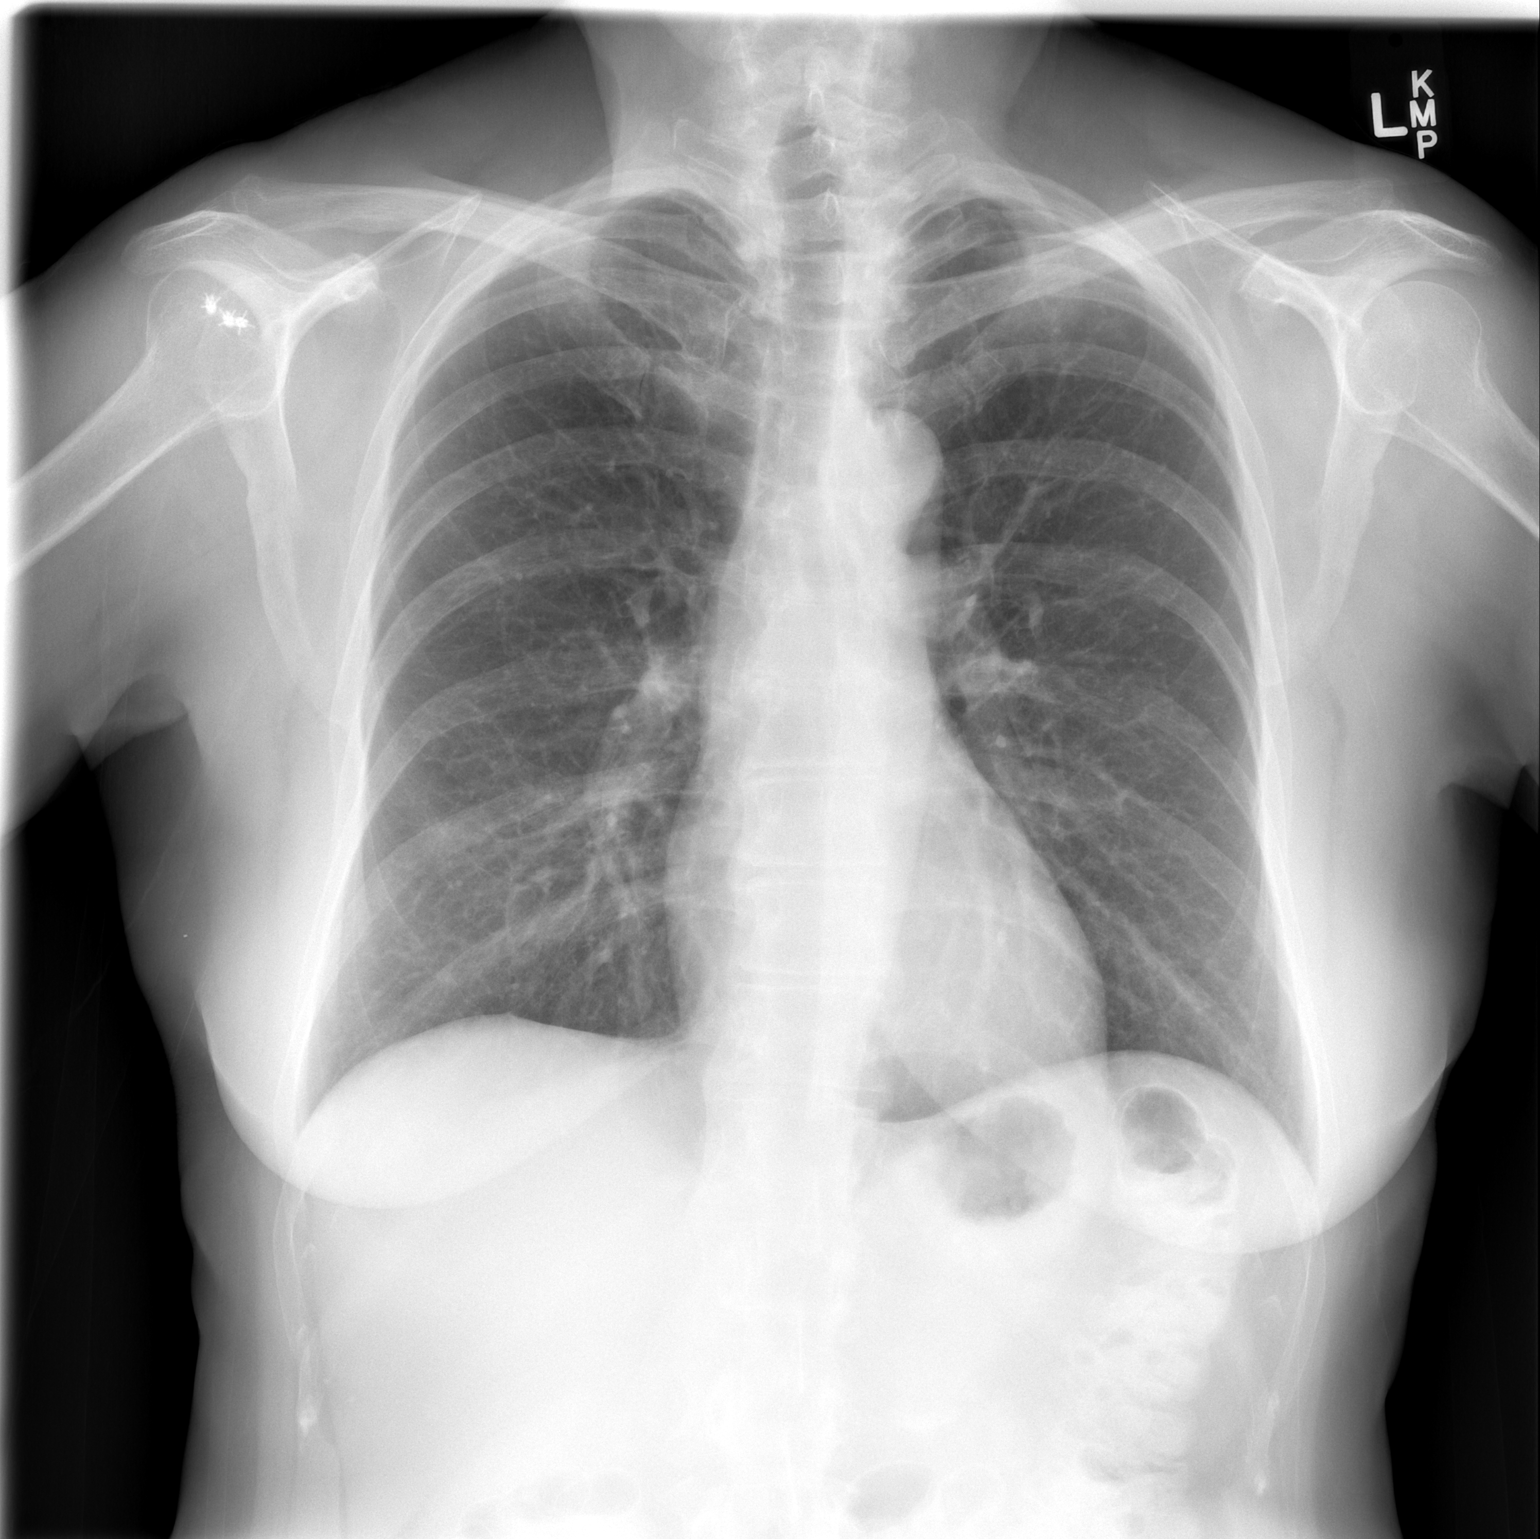

[w chest lat]
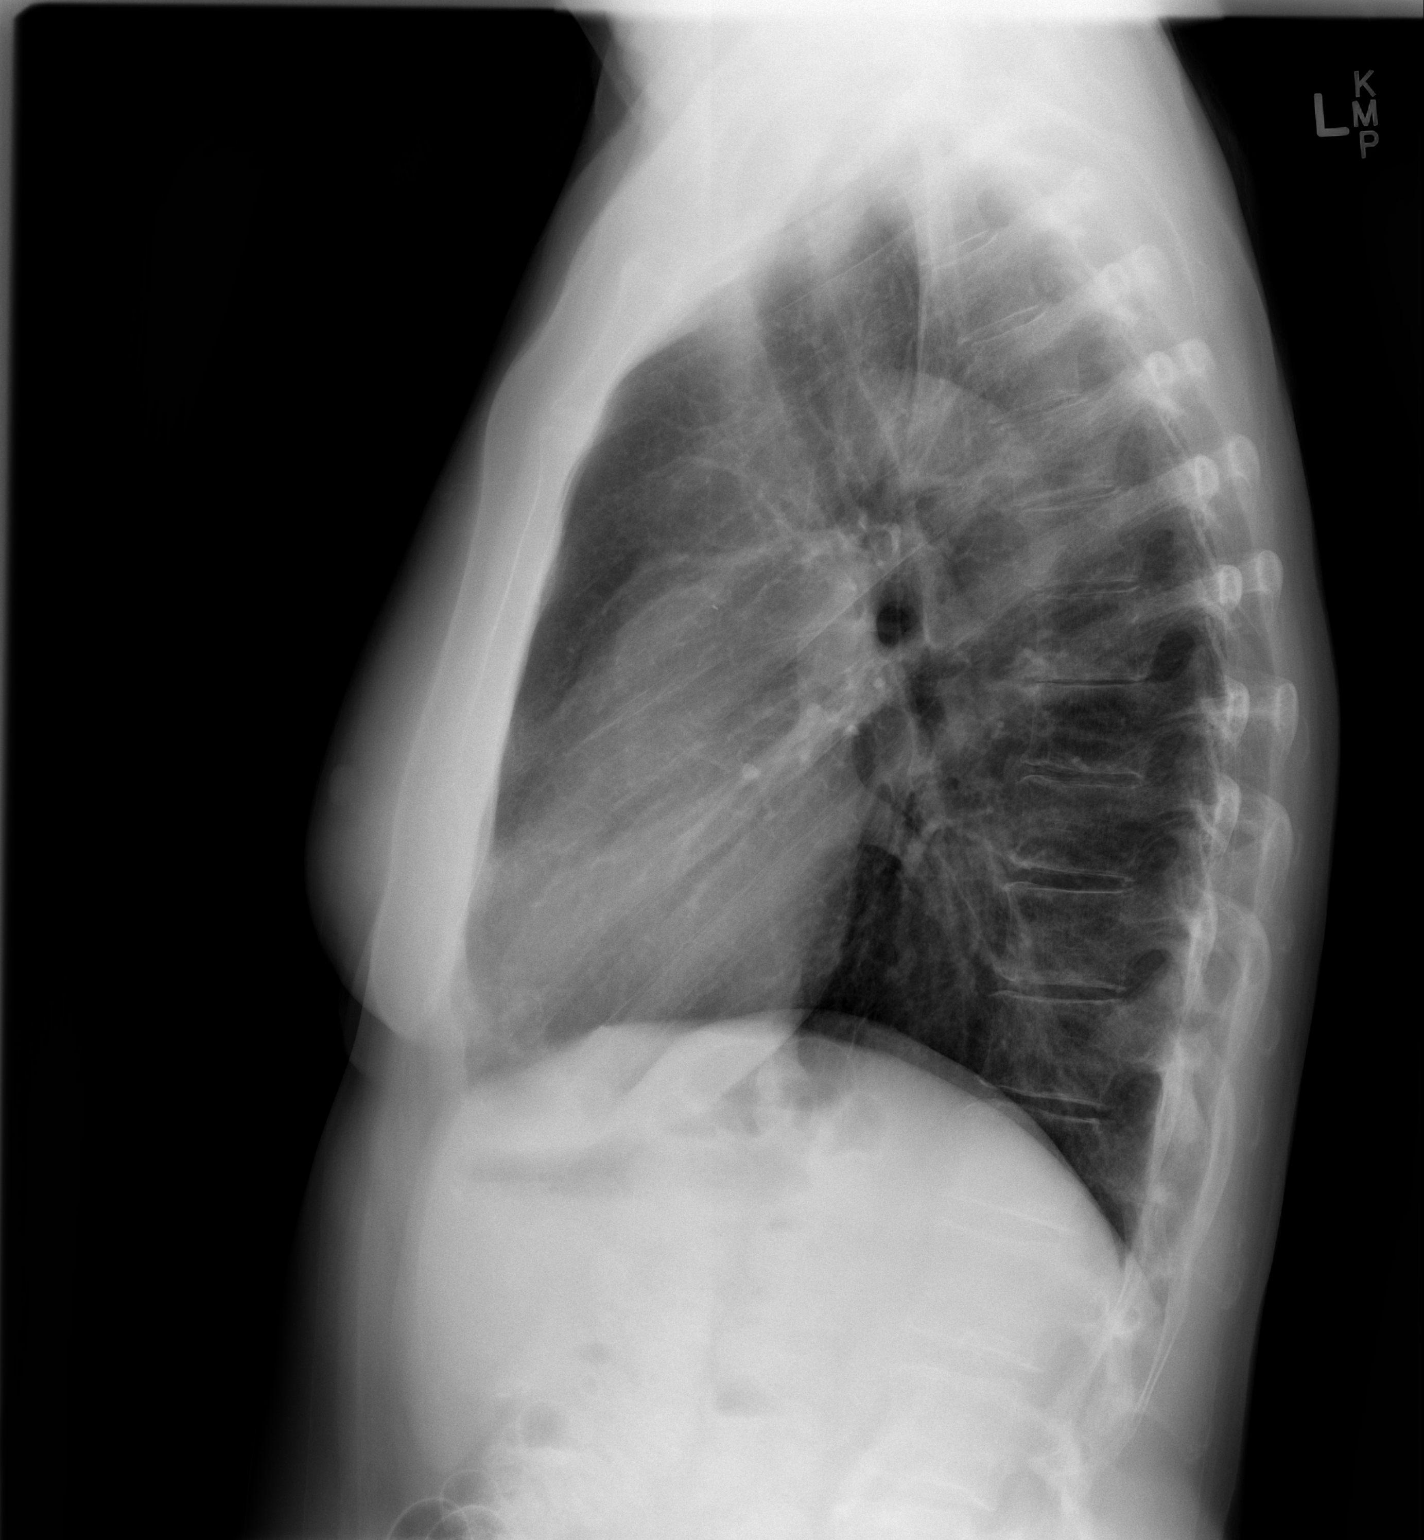

[2 of 2 positions shown; findings below may reference images not displayed]

FINDINGS: The heart size and mediastinal contours are within normal limits.
Both lungs are clear. The visualized skeletal structures are
unremarkable. Right humeral bone anchors.
IMPRESSION: No active cardiopulmonary disease.

## 2016-01-05 ENCOUNTER — Other Ambulatory Visit: Payer: Self-pay | Admitting: Internal Medicine

## 2016-01-05 DIAGNOSIS — C6990 Malignant neoplasm of unspecified site of unspecified eye: Secondary | ICD-10-CM

## 2016-01-08 ENCOUNTER — Other Ambulatory Visit (HOSPITAL_COMMUNITY): Payer: Self-pay | Admitting: Interventional Radiology

## 2016-01-08 DIAGNOSIS — I729 Aneurysm of unspecified site: Secondary | ICD-10-CM

## 2016-01-12 ENCOUNTER — Other Ambulatory Visit: Payer: Self-pay | Admitting: Internal Medicine

## 2016-01-12 ENCOUNTER — Ambulatory Visit
Admission: RE | Admit: 2016-01-12 | Discharge: 2016-01-12 | Disposition: A | Discharge status: Home / Self Care | Payer: 59 | Source: Ambulatory Visit | Attending: Internal Medicine | Admitting: Internal Medicine

## 2016-01-12 DIAGNOSIS — C6990 Malignant neoplasm of unspecified site of unspecified eye: Secondary | ICD-10-CM

## 2016-01-12 DIAGNOSIS — C439 Malignant melanoma of skin, unspecified: Secondary | ICD-10-CM

## 2016-01-20 ENCOUNTER — Ambulatory Visit (HOSPITAL_COMMUNITY)
Admission: RE | Admit: 2016-01-20 | Discharge: 2016-01-20 | Disposition: A | Discharge status: Home / Self Care | Payer: 59 | Source: Ambulatory Visit | Attending: Interventional Radiology | Admitting: Interventional Radiology

## 2016-01-20 ENCOUNTER — Ambulatory Visit (HOSPITAL_COMMUNITY): Payer: 59

## 2016-01-20 DIAGNOSIS — Z95828 Presence of other vascular implants and grafts: Secondary | ICD-10-CM | POA: Insufficient documentation

## 2016-01-20 DIAGNOSIS — I720 Aneurysm of carotid artery: Secondary | ICD-10-CM | POA: Insufficient documentation

## 2016-01-20 DIAGNOSIS — I729 Aneurysm of unspecified site: Secondary | ICD-10-CM

## 2016-01-20 LAB — CREATININE, SERUM
Creatinine, Ser: 0.85 mg/dL (ref 0.44–1.00)
GFR calc Af Amer: >60 mL/min (ref >60)
GFR calc non Af Amer: >60 mL/min (ref >60)

## 2016-01-20 MED ORDER — GADOBENATE DIMEGLUMINE 529 MG/ML IV SOLN
15.0000 mL | Freq: Once | INTRAVENOUS | Status: AC
  Administered 2016-01-20: 13 mL via INTRAVENOUS

## 2016-01-27 ENCOUNTER — Telehealth (HOSPITAL_COMMUNITY): Payer: Self-pay

## 2016-01-27 NOTE — Telephone Encounter (Signed)
Left message for pt to return call. AW 

## 2016-04-20 ENCOUNTER — Other Ambulatory Visit: Payer: Self-pay | Admitting: Physician Assistant

## 2016-07-21 ENCOUNTER — Other Ambulatory Visit: Payer: Self-pay | Admitting: Physician Assistant

## 2016-09-27 ENCOUNTER — Other Ambulatory Visit: Payer: Self-pay | Admitting: Internal Medicine

## 2016-09-27 DIAGNOSIS — Z1231 Encounter for screening mammogram for malignant neoplasm of breast: Secondary | ICD-10-CM

## 2016-09-30 ENCOUNTER — Ambulatory Visit
Admission: RE | Admit: 2016-09-30 | Discharge: 2016-09-30 | Disposition: A | Discharge status: Home / Self Care | Payer: 59 | Source: Ambulatory Visit | Attending: Internal Medicine | Admitting: Internal Medicine

## 2016-09-30 DIAGNOSIS — Z1231 Encounter for screening mammogram for malignant neoplasm of breast: Secondary | ICD-10-CM

## 2016-10-01 ENCOUNTER — Ambulatory Visit: Payer: 59

## 2017-01-11 ENCOUNTER — Other Ambulatory Visit: Payer: Self-pay | Admitting: Internal Medicine

## 2017-01-17 ENCOUNTER — Other Ambulatory Visit: Payer: Self-pay | Admitting: Internal Medicine

## 2017-01-17 DIAGNOSIS — C6992 Malignant neoplasm of unspecified site of left eye: Secondary | ICD-10-CM

## 2017-01-18 ENCOUNTER — Other Ambulatory Visit: Payer: Self-pay | Admitting: Physician Assistant

## 2017-01-21 ENCOUNTER — Other Ambulatory Visit: Payer: Self-pay | Admitting: Internal Medicine

## 2017-01-21 ENCOUNTER — Ambulatory Visit
Admission: RE | Admit: 2017-01-21 | Discharge: 2017-01-21 | Disposition: A | Discharge status: Home / Self Care | Payer: 59 | Source: Ambulatory Visit | Attending: Internal Medicine | Admitting: Internal Medicine

## 2017-01-21 DIAGNOSIS — C6992 Malignant neoplasm of unspecified site of left eye: Secondary | ICD-10-CM

## 2017-02-01 ENCOUNTER — Other Ambulatory Visit (HOSPITAL_COMMUNITY): Payer: Self-pay | Admitting: Interventional Radiology

## 2017-02-01 DIAGNOSIS — I729 Aneurysm of unspecified site: Secondary | ICD-10-CM

## 2017-02-17 ENCOUNTER — Ambulatory Visit (HOSPITAL_COMMUNITY): Payer: 59

## 2017-02-17 ENCOUNTER — Ambulatory Visit (HOSPITAL_COMMUNITY)
Admission: RE | Admit: 2017-02-17 | Discharge: 2017-02-17 | Disposition: A | Discharge status: Home / Self Care | Payer: 59 | Source: Ambulatory Visit | Attending: Interventional Radiology | Admitting: Interventional Radiology

## 2017-02-17 DIAGNOSIS — I729 Aneurysm of unspecified site: Secondary | ICD-10-CM | POA: Diagnosis present

## 2017-02-17 LAB — CREATININE, SERUM
CREATININE: 0.83 mg/dL (ref 0.44–1.00)
GFR calc non Af Amer: >60 mL/min (ref >60)

## 2017-02-17 MED ORDER — GADOBENATE DIMEGLUMINE 529 MG/ML IV SOLN
7.0000 mL | Freq: Once | INTRAVENOUS | Status: AC | PRN
  Administered 2017-02-17: 7 mL via INTRAVENOUS

## 2017-02-21 ENCOUNTER — Telehealth (HOSPITAL_COMMUNITY): Payer: Self-pay

## 2017-02-21 NOTE — Telephone Encounter (Signed)
Pt agreed to f/u in 1 yr with mri/mra. AW 

## 2017-05-10 ENCOUNTER — Ambulatory Visit (INDEPENDENT_AMBULATORY_CARE_PROVIDER_SITE_OTHER): Payer: 59 | Admitting: Sports Medicine

## 2017-05-10 ENCOUNTER — Encounter: Payer: Self-pay | Admitting: Sports Medicine

## 2017-05-10 VITALS — BP 100/70 | Ht 66.0 in | Wt 134.0 lb

## 2017-05-10 DIAGNOSIS — M67912 Unspecified disorder of synovium and tendon, left shoulder: Secondary | ICD-10-CM | POA: Insufficient documentation

## 2017-05-10 MED ORDER — MELOXICAM 15 MG PO TABS: ORAL_TABLET | ORAL | 0 refills | Status: DC

## 2017-05-10 NOTE — Assessment & Plan Note (Signed)
-  Exam consistent with rotator cuff pathology.  Given normal range of motion, do not suspect any significant tear at this time. -Recommended and reviewed rotator cuff exercises. -To try Mobic 15 mg daily for 5 days and then as needed thereafter. -To return in 3-4 week to reassess symptoms.  Would perform an ultrasound at that time if no improvement.

## 2017-05-10 NOTE — Progress Notes (Addendum)
[facility] Progress Note  Subjective:  Alexandra Dennis is a 67 y.o. female with history of right biceps tendon tear, right rotator cuff tear status post surgical repair, and bilateral osteoarthritis of knees who presents for left shoulder pain since October/November.  This began while participating in personal trainer exercise sessions during which she was doing overhead arm exercises.  She reports pain when lifting anything of weight on the left, e.g., lifting trash into the trashcan.  Pain bothers her at night and with certain reaching motions.  She has been having to sleep on her right side. She tried OTC anti-inflammatories with minimal relief.  She took February off from exercising to give her shoulder a break, but she still has significant discomfort.  She reports having good days and bad days.  Today is a good day whereas 2 days ago her shoulder was causing significant pain.  She is right-handed.  She played volleyball softball and tennis in college and enjoys remaining physically active.  She denies any new numbness or tingling into her left arm or hand, but does report some tingling in her left hand that has been ongoing secondary to carpal tunnel syndrome. Patient did have platelet rich plasma injected into her left shoulder last week because she was already getting this treatment for her knees and asked to try it in her shoulder as well. No known injury to area.  She denies rash.   No Known Allergies  Social History   Tobacco Use  . Smoking status: Former Smoker    Last attempt to quit: 06/14/1983    Years since quitting: 33.9  . Smokeless tobacco: Never Used  Substance Use Topics  . Alcohol use: Yes    Alcohol/week: 4.2 oz    Types: 7 Glasses of wine per week    Objective: Blood pressure 100/70, height 5\' 6"  (1.676 m), weight 134 lb (60.8 kg). Body mass index is 21.63 kg/m. Constitutional: Well-appearing female in no apparent distress.  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  Surgical scar over anterior right shoulder.  No obvious bruising or swelling of left shoulder or upper arm.  No pain over AC joint on left or right.  Normal range of motion with internal and external rotation of shoulders bilaterally. Patient can reach behind back and touch shoulder on each side. Pain with resisted external rotation on left and with downward pressure applied with empty can testing.  Negative liftoff testing. Positive painful arc on the left. Rotator cuff strength 5/5. Neurological: Peripheral sensation intact.  Skin: Skin is warm and dry. No rash noted.  Psychiatric: Normal mood and affect.  Vitals reviewed  Assessment/Plan: Tendinopathy of left rotator cuff -Exam consistent with rotator cuff pathology.  Given normal range of motion, do not suspect any significant tear at this time. -Recommended and reviewed rotator cuff exercises. -To try Mobic 15 mg daily for 5 days and then as needed thereafter. -To return in 3-4 week to reassess symptoms.  Would perform an ultrasound at that time if no improvement.  Follow-up in 3 weeks.  Olene Floss, MD [place], PGY-3  Patient seen and evaluated with the resident. I agree with the above plan of care. Patient's exam is consistent with rotator cuff impingement and tendinopathy. She may have some degree of interstitial tearing but she has full-strength and full range of motion. We will proceed with treatment as above and the patient will follow-up with me in 3-4 weeks for reevaluation. If she  is not improving then our plan is to start with an ultrasound specifically to rule out a rotator cuff tear. We will make sure that we block off enough time in clinic to do this.

## 2017-05-31 ENCOUNTER — Ambulatory Visit (INDEPENDENT_AMBULATORY_CARE_PROVIDER_SITE_OTHER): Payer: 59 | Admitting: Sports Medicine

## 2017-05-31 VITALS — BP 112/80 | Ht 66.0 in | Wt 135.0 lb

## 2017-05-31 DIAGNOSIS — M67912 Unspecified disorder of synovium and tendon, left shoulder: Secondary | ICD-10-CM

## 2017-05-31 NOTE — Assessment & Plan Note (Addendum)
-   Improving. No tear seen on ultrasound. Strength intact but still having discomfort.  - Recommended continuing ROM exercises and avoiding overhead weight training.  - May continue prn anti-inflammatories.

## 2017-05-31 NOTE — Progress Notes (Addendum)
[facility] Progress Note  Subjective:  Alexandra Dennis is a 67 y.o. female who presents for follow-up of left rotator cuff tendonitis. She was seen about 3 weeks ago and reports feeling about 60% better with intermittent use of mobic. She still has discomfort at night with position changes and with holding anything above her shoulder. She has been doing ROM exercises but slacked last week while traveling. She plans to restart working with her personal trainer and would like to know what exercises she should avoid. She would prefer to have US of the shoulder to know definitively whether or not she may have a tear. Of note, she did have platelet rich plasma injected into her shoulder about 4 weeks ago when she also had her knees injected; she thinks she may be having less pain with activity of her knees. She reports feeling some increased tension in her upper back. ROS: No rash, no falls  No Known Allergies  Social History   Tobacco Use  . Smoking status: Former Smoker    Last attempt to quit: 06/14/1983    Years since quitting: 33.9  . Smokeless tobacco: Never Used  Substance Use Topics  . Alcohol use: Yes    Alcohol/week: 4.2 oz    Types: 7 Glasses of wine per week    Objective: Blood pressure 112/80, height 5\' 6"  (1.676 m), weight 135 lb (61.2 kg). Body mass index is 21.79 kg/m. Constitutional: Athletic female in NAD Cardiovascular: RRR, S1, S2, no m/r/g.  Pulmonary/Chest: Effort normal and breath sounds normal.  Musculoskeletal: "Popeye" deformity of right upper arm. Reports discomfort with external rotation. Equal and ~85 degrees of rotation with active external rotation bilaterally but has pain and limited movement to about 45 degrees on left with applied resistance. Full abduction and flexion at shoulder bilaterally but still with "painful arc." Strength intact with empty can testing bilaterally but does report some discomfort on left.  Neurological: AOx3, no focal  deficits. Skin: Skin is warm and dry. No rash noted.  Psychiatric: Normal mood and affect.  Vitals reviewed  Left shoulder [place]: Degenerative changes with osteophytes over AC joint. Increased hypogenicity of bursa (mild bursitis). Intact rotator cuff without obvious tears with supraspinatus, infraspinatus, subscapularis, and teres minor views.   Assessment/Plan: Tendinopathy of left rotator cuff - Improving. No tear seen on ultrasound. Strength intact but still having discomfort.  - Recommended continuing ROM exercises and avoiding overhead weight training.  - May continue prn anti-inflammatories.   Follow-up as needed.  Olene Floss, MD [place], PGY-3  Patient seen and evaluated with the resident. I agree with the above plan of care. Ultrasound evaluation today does not show any definitive rotator cuff tear. She does have some subacromial bursitis. She is about 60% improved since her last visit. We will continue with our current treatment emphasizing home exercises. If symptoms do not continue to improve then I would consider a subacromial cortisone injection prior to further diagnostic imaging. Patient will follow-up for ongoing or recalcitrant issues.

## 2017-06-01 ENCOUNTER — Encounter: Payer: Self-pay | Admitting: Sports Medicine

## 2017-09-15 NOTE — Telephone Encounter (Signed)
Phone call attempt 

## 2017-11-09 ENCOUNTER — Other Ambulatory Visit: Payer: Self-pay | Admitting: Internal Medicine

## 2017-11-09 DIAGNOSIS — Z1231 Encounter for screening mammogram for malignant neoplasm of breast: Secondary | ICD-10-CM

## 2017-12-12 ENCOUNTER — Ambulatory Visit
Admission: RE | Admit: 2017-12-12 | Discharge: 2017-12-12 | Disposition: A | Discharge status: Home / Self Care | Payer: 59 | Source: Ambulatory Visit | Attending: Internal Medicine | Admitting: Internal Medicine

## 2017-12-12 DIAGNOSIS — Z1231 Encounter for screening mammogram for malignant neoplasm of breast: Secondary | ICD-10-CM

## 2018-01-10 ENCOUNTER — Other Ambulatory Visit: Payer: Self-pay | Admitting: Internal Medicine

## 2018-01-10 DIAGNOSIS — E2839 Other primary ovarian failure: Secondary | ICD-10-CM

## 2018-01-18 ENCOUNTER — Ambulatory Visit
Admission: RE | Admit: 2018-01-18 | Discharge: 2018-01-18 | Disposition: A | Discharge status: Home / Self Care | Payer: 59 | Source: Ambulatory Visit | Attending: Internal Medicine | Admitting: Internal Medicine

## 2018-01-18 DIAGNOSIS — E2839 Other primary ovarian failure: Secondary | ICD-10-CM

## 2018-02-09 ENCOUNTER — Other Ambulatory Visit: Payer: Self-pay | Admitting: Internal Medicine

## 2018-02-09 DIAGNOSIS — C6992 Malignant neoplasm of unspecified site of left eye: Secondary | ICD-10-CM

## 2018-02-13 ENCOUNTER — Inpatient Hospital Stay: Admission: RE | Admit: 2018-02-13 | Payer: 59 | Source: Ambulatory Visit

## 2018-02-20 ENCOUNTER — Other Ambulatory Visit: Payer: Self-pay | Admitting: Internal Medicine

## 2018-02-20 ENCOUNTER — Ambulatory Visit
Admission: RE | Admit: 2018-02-20 | Discharge: 2018-02-20 | Disposition: A | Discharge status: Home / Self Care | Payer: 59 | Source: Ambulatory Visit | Attending: Internal Medicine | Admitting: Internal Medicine

## 2018-02-20 DIAGNOSIS — C6992 Malignant neoplasm of unspecified site of left eye: Secondary | ICD-10-CM

## 2018-04-05 ENCOUNTER — Telehealth (HOSPITAL_COMMUNITY): Payer: Self-pay

## 2018-04-05 NOTE — Telephone Encounter (Signed)
Called to schedule f/u mri, no answer, left vm. AW 

## 2018-04-10 ENCOUNTER — Other Ambulatory Visit (HOSPITAL_COMMUNITY): Payer: Self-pay | Admitting: Interventional Radiology

## 2018-04-10 ENCOUNTER — Telehealth (HOSPITAL_COMMUNITY): Payer: Self-pay

## 2018-04-10 DIAGNOSIS — I729 Aneurysm of unspecified site: Secondary | ICD-10-CM

## 2018-04-10 NOTE — Telephone Encounter (Signed)
Called to schedule mri, no answer, left vm. AW 

## 2018-04-24 ENCOUNTER — Ambulatory Visit (HOSPITAL_COMMUNITY): Payer: 59

## 2018-04-24 ENCOUNTER — Ambulatory Visit (HOSPITAL_COMMUNITY)
Admission: RE | Admit: 2018-04-24 | Discharge: 2018-04-24 | Disposition: A | Discharge status: Home / Self Care | Payer: 59 | Source: Ambulatory Visit | Attending: Interventional Radiology | Admitting: Interventional Radiology

## 2018-04-24 DIAGNOSIS — I729 Aneurysm of unspecified site: Secondary | ICD-10-CM | POA: Diagnosis not present

## 2018-04-26 ENCOUNTER — Telehealth (HOSPITAL_COMMUNITY): Payer: Self-pay

## 2018-04-26 ENCOUNTER — Other Ambulatory Visit (HOSPITAL_COMMUNITY): Payer: Self-pay | Admitting: Interventional Radiology

## 2018-04-26 DIAGNOSIS — I729 Aneurysm of unspecified site: Secondary | ICD-10-CM

## 2018-04-26 NOTE — Telephone Encounter (Signed)
Called pt regarding recent scan, no answer, left vm. AW

## 2018-05-18 ENCOUNTER — Ambulatory Visit (HOSPITAL_COMMUNITY)
Admission: RE | Admit: 2018-05-18 | Discharge: 2018-05-18 | Disposition: A | Discharge status: Home / Self Care | Payer: 59 | Source: Ambulatory Visit | Attending: Interventional Radiology | Admitting: Interventional Radiology

## 2018-05-18 ENCOUNTER — Other Ambulatory Visit: Payer: Self-pay

## 2018-05-18 ENCOUNTER — Ambulatory Visit (HOSPITAL_COMMUNITY): Admission: RE | Admit: 2018-05-18 | Payer: 59 | Source: Ambulatory Visit

## 2018-05-18 DIAGNOSIS — I729 Aneurysm of unspecified site: Secondary | ICD-10-CM
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
## 2018-11-28 ENCOUNTER — Other Ambulatory Visit: Payer: Self-pay | Admitting: Internal Medicine

## 2018-11-28 DIAGNOSIS — Z1231 Encounter for screening mammogram for malignant neoplasm of breast: Secondary | ICD-10-CM

## 2019-01-09 ENCOUNTER — Ambulatory Visit
Admission: RE | Admit: 2019-01-09 | Discharge: 2019-01-09 | Disposition: A | Discharge status: Home / Self Care | Payer: 59 | Source: Ambulatory Visit | Attending: Internal Medicine | Admitting: Internal Medicine

## 2019-01-09 ENCOUNTER — Other Ambulatory Visit: Payer: Self-pay

## 2019-01-09 DIAGNOSIS — Z1231 Encounter for screening mammogram for malignant neoplasm of breast: Secondary | ICD-10-CM

## 2019-01-17 ENCOUNTER — Other Ambulatory Visit: Payer: Self-pay | Admitting: Internal Medicine

## 2019-01-17 DIAGNOSIS — C6990 Malignant neoplasm of unspecified site of unspecified eye: Secondary | ICD-10-CM

## 2019-01-23 ENCOUNTER — Ambulatory Visit
Admission: RE | Admit: 2019-01-23 | Discharge: 2019-01-23 | Disposition: A | Discharge status: Home / Self Care | Payer: 59 | Source: Ambulatory Visit | Attending: Internal Medicine | Admitting: Internal Medicine

## 2019-01-23 DIAGNOSIS — C6990 Malignant neoplasm of unspecified site of unspecified eye: Secondary | ICD-10-CM

## 2019-05-08 ENCOUNTER — Other Ambulatory Visit: Payer: Self-pay | Admitting: Physician Assistant

## 2019-06-13 ENCOUNTER — Other Ambulatory Visit (HOSPITAL_COMMUNITY): Payer: Self-pay | Admitting: Interventional Radiology

## 2019-06-13 DIAGNOSIS — I671 Cerebral aneurysm, nonruptured: Secondary | ICD-10-CM

## 2019-06-26 ENCOUNTER — Ambulatory Visit (HOSPITAL_COMMUNITY)
Admission: RE | Admit: 2019-06-26 | Discharge: 2019-06-26 | Disposition: A | Discharge status: Home / Self Care | Payer: 59 | Source: Ambulatory Visit | Attending: Interventional Radiology | Admitting: Interventional Radiology

## 2019-06-26 ENCOUNTER — Ambulatory Visit (HOSPITAL_COMMUNITY): Payer: 59

## 2019-06-26 ENCOUNTER — Other Ambulatory Visit: Payer: Self-pay

## 2019-06-26 DIAGNOSIS — I671 Cerebral aneurysm, nonruptured: Secondary | ICD-10-CM

## 2019-06-27 ENCOUNTER — Telehealth (HOSPITAL_COMMUNITY): Payer: Self-pay

## 2019-06-27 NOTE — Telephone Encounter (Signed)
Called pt regarding recent mri, no answer, left vm. AW 

## 2019-06-27 NOTE — Telephone Encounter (Signed)
Pt agreed to f/u in 1 year with mri/mra. AW  

## 2019-11-08 ENCOUNTER — Encounter: Payer: Self-pay | Admitting: Physician Assistant

## 2019-11-08 ENCOUNTER — Ambulatory Visit (INDEPENDENT_AMBULATORY_CARE_PROVIDER_SITE_OTHER): Payer: 59 | Admitting: Physician Assistant

## 2019-11-08 ENCOUNTER — Other Ambulatory Visit: Payer: Self-pay

## 2019-11-08 DIAGNOSIS — L920 Granuloma annulare: Secondary | ICD-10-CM | POA: Diagnosis not present

## 2019-11-08 DIAGNOSIS — Z85820 Personal history of malignant melanoma of skin: Secondary | ICD-10-CM | POA: Diagnosis not present

## 2019-11-08 DIAGNOSIS — Z1283 Encounter for screening for malignant neoplasm of skin: Secondary | ICD-10-CM | POA: Diagnosis not present

## 2019-11-08 DIAGNOSIS — L570 Actinic keratosis: Secondary | ICD-10-CM | POA: Diagnosis not present

## 2019-11-08 MED ORDER — TRIAMCINOLONE ACETONIDE 40 MG/ML IJ SUSP
20.0000 mg | Freq: Once | INTRAMUSCULAR | Status: AC
  Administered 2019-11-08: 20 mg

## 2019-11-08 NOTE — Progress Notes (Signed)
   Follow-Up Visit   Subjective  Alexandra Dennis is a 69 y.o. female who presents for the following: Annual Exam (no new concerns).   The following portions of the chart were reviewed this encounter and updated as appropriate: Tobacco  Allergies  Meds  Problems  Med Hx  Surg Hx  Fam Hx      Objective  Well appearing patient in no apparent distress; mood and affect are within normal limits.  A full examination was performed including scalp, head, eyes, ears, nose, lips, neck, chest, axillae, abdomen, back, buttocks, bilateral upper extremities, bilateral lower extremities, hands, feet, fingers, toes, fingernails, and toenails. All findings within normal limits unless otherwise noted below.  Objective  Head -to toe: No atypical nevi No signs of non-mole skin cancer.   Objective  Left Elbow - Posterior: Arciform nodule  Objective  back (10): Erythematous patches with gritty scale.  Objective  Left Eye: Normal appearance.    Assessment & Plan  Screening exam for skin cancer Head -to toe  Biannual skin examinations  Granuloma annulare Left Elbow - Posterior  Intralesional injection - Left Elbow - Posterior Location: left elbow  Informed Consent: Discussed risks (infection, pain, bleeding, bruising, thinning of the skin, loss of skin pigment, lack of resolution, and recurrence of lesion) and benefits of the procedure, as well as the alternatives. Informed consent was obtained. Preparation: The area was prepared a standard fashion.  Anesthesia:none  Procedure Details: An intralesional injection was performed with Kenalog 20 mg/cc. 0.3cc in total were injected.  Total number of injections: 3  Plan: The patient was instructed on post-op care. Recommend OTC analgesia as needed for pain.   AK (actinic keratosis) (10) back  Destruction of lesion - back Complexity: simple   Destruction method: cryotherapy   Informed consent: discussed and consent obtained     Timeout:  patient name, date of birth, surgical site, and procedure verified Lesion destroyed using liquid nitrogen: Yes   Cryotherapy cycles:  1 Outcome: patient tolerated procedure well with no complications   Post-procedure details: wound care instructions given    Personal history of malignant melanoma of skin Left Eye  Followed by oncology    I, Trudie Cervantes, PA-C, have reviewed all documentation's for this visit.  The documentation on 11/08/19 for the exam, diagnosis, procedures and orders are all accurate and complete.

## 2019-12-21 ENCOUNTER — Other Ambulatory Visit: Payer: Self-pay | Admitting: Internal Medicine

## 2019-12-21 DIAGNOSIS — Z0000 Encounter for general adult medical examination without abnormal findings: Secondary | ICD-10-CM

## 2020-01-18 ENCOUNTER — Other Ambulatory Visit: Payer: Self-pay | Admitting: Internal Medicine

## 2020-01-18 ENCOUNTER — Ambulatory Visit: Payer: 59

## 2020-01-18 DIAGNOSIS — C6992 Malignant neoplasm of unspecified site of left eye: Secondary | ICD-10-CM

## 2020-01-30 ENCOUNTER — Other Ambulatory Visit: Payer: Self-pay

## 2020-01-30 ENCOUNTER — Ambulatory Visit
Admission: RE | Admit: 2020-01-30 | Discharge: 2020-01-30 | Disposition: A | Discharge status: Home / Self Care | Payer: 59 | Source: Ambulatory Visit | Attending: Internal Medicine | Admitting: Internal Medicine

## 2020-01-30 DIAGNOSIS — Z0000 Encounter for general adult medical examination without abnormal findings: Secondary | ICD-10-CM

## 2020-02-04 ENCOUNTER — Ambulatory Visit (INDEPENDENT_AMBULATORY_CARE_PROVIDER_SITE_OTHER): Payer: 59 | Admitting: Physician Assistant

## 2020-02-04 ENCOUNTER — Other Ambulatory Visit: Payer: Self-pay

## 2020-02-04 ENCOUNTER — Encounter: Payer: Self-pay | Admitting: Physician Assistant

## 2020-02-04 DIAGNOSIS — Z1283 Encounter for screening for malignant neoplasm of skin: Secondary | ICD-10-CM

## 2020-02-04 DIAGNOSIS — L820 Inflamed seborrheic keratosis: Secondary | ICD-10-CM | POA: Diagnosis not present

## 2020-02-04 DIAGNOSIS — C44622 Squamous cell carcinoma of skin of right upper limb, including shoulder: Secondary | ICD-10-CM | POA: Diagnosis not present

## 2020-02-04 DIAGNOSIS — Z85840 Personal history of malignant neoplasm of eye: Secondary | ICD-10-CM | POA: Diagnosis not present

## 2020-02-04 DIAGNOSIS — C6990 Malignant neoplasm of unspecified site of unspecified eye: Secondary | ICD-10-CM

## 2020-02-04 DIAGNOSIS — C4492 Squamous cell carcinoma of skin, unspecified: Secondary | ICD-10-CM

## 2020-02-04 HISTORY — DX: Squamous cell carcinoma of skin, unspecified: C44.92

## 2020-02-04 NOTE — Progress Notes (Addendum)
   Follow-Up Visit   Subjective  Alexandra Dennis is a 69 y.o. female who presents for the following: Skin Problem (right upper arm- "almost gone"  did itch and rasied).   The following portions of the chart were reviewed this encounter and updated as appropriate: Tobacco  Allergies  Meds  Problems  Med Hx  Surg Hx  Fam Hx      Objective  Well appearing patient in no apparent distress; mood and affect are within normal limits.  Skin of the neck and arms were examined today.  Objective  Left Anterior Neck: Erythematous stuck-on, waxy papule or plaque.   Objective  arms and neck: No atypical nevi. No signs of non-mole skin cancer.   Objective  Left Eye: Occular   Objective  Right Elbow - lateral: Pink base with hyperkeratotic white crust     Assessment & Plan  Neoplasm of uncertain behavior of skin Right Elbow - lateral  Epidermal / dermal shaving  Lesion diameter (cm):  0.6 Informed consent: discussed and consent obtained   Timeout: patient name, date of birth, surgical site, and procedure verified   Procedure prep:  Patient was prepped and draped in usual sterile fashion Prep type:  Chlorhexidine Anesthesia: the lesion was anesthetized in a standard fashion   Anesthetic:  1% lidocaine w/ epinephrine 1-100,000 local infiltration Instrument used: DermaBlade   Hemostasis achieved with: aluminum chloride   Outcome: patient tolerated procedure well   Post-procedure details: sterile dressing applied and wound care instructions given   Dressing type: petrolatum gauze, petrolatum and bandage    Specimen 1 - Surgical pathology Differential Diagnosis: *KA. Rule out atypia Check Margins: yes  Inflamed seborrheic keratosis Left Anterior Neck  Destruction of lesion - Left Anterior Neck Complexity: simple   Destruction method: cryotherapy   Informed consent: discussed and consent obtained   Timeout:  patient name, date of birth, surgical site, and procedure  verified Lesion destroyed using liquid nitrogen: Yes   Cryotherapy cycles:  1 Outcome: patient tolerated procedure well with no complications   Post-procedure details: wound care instructions given    Encounter for screening for malignant neoplasm of skin arms and neck  Biannual skin exam  Personal history of occular melanoma Left Eye  Biannual skin exams and eye exams.   I, Stryder Poitra, PA-C, have reviewed all documentation's for this visit.  The documentation on 02/06/20 for the exam, diagnosis, procedures and orders are all accurate and complete.

## 2020-02-04 NOTE — Patient Instructions (Signed)

## 2020-02-05 ENCOUNTER — Other Ambulatory Visit: Payer: Self-pay | Admitting: Internal Medicine

## 2020-02-05 ENCOUNTER — Ambulatory Visit
Admission: RE | Admit: 2020-02-05 | Discharge: 2020-02-05 | Disposition: A | Discharge status: Home / Self Care | Payer: 59 | Source: Ambulatory Visit | Attending: Internal Medicine | Admitting: Internal Medicine

## 2020-02-05 DIAGNOSIS — C6992 Malignant neoplasm of unspecified site of left eye: Secondary | ICD-10-CM

## 2020-02-06 NOTE — Addendum Note (Signed)
Addended by: Robyne Askew R on: 02/06/2020 03:32 PM   Modules accepted: Level of Service

## 2020-02-07 ENCOUNTER — Telehealth: Payer: Self-pay

## 2020-02-07 NOTE — Telephone Encounter (Signed)
-----   Message from Warren Danes, [place] sent at 02/06/2020  3:26 PM EST ----- Needs 30 minute surgery. NOT MELANOMA

## 2020-02-07 NOTE — Telephone Encounter (Signed)
Phone call to patient with her pathology results. Patient aware of results.  

## 2020-04-17 ENCOUNTER — Ambulatory Visit (INDEPENDENT_AMBULATORY_CARE_PROVIDER_SITE_OTHER): Payer: 59 | Admitting: Physician Assistant

## 2020-04-17 ENCOUNTER — Encounter: Payer: Self-pay | Admitting: Physician Assistant

## 2020-04-17 ENCOUNTER — Other Ambulatory Visit: Payer: Self-pay

## 2020-04-17 DIAGNOSIS — C44612 Basal cell carcinoma of skin of right upper limb, including shoulder: Secondary | ICD-10-CM | POA: Diagnosis not present

## 2020-04-17 DIAGNOSIS — C4492 Squamous cell carcinoma of skin, unspecified: Secondary | ICD-10-CM

## 2020-04-17 NOTE — Patient Instructions (Signed)

## 2020-04-30 ENCOUNTER — Encounter: Payer: Self-pay | Admitting: Physician Assistant

## 2020-04-30 ENCOUNTER — Other Ambulatory Visit: Payer: Self-pay

## 2020-04-30 ENCOUNTER — Ambulatory Visit (INDEPENDENT_AMBULATORY_CARE_PROVIDER_SITE_OTHER): Payer: 59 | Admitting: Physician Assistant

## 2020-04-30 DIAGNOSIS — D485 Neoplasm of uncertain behavior of skin: Secondary | ICD-10-CM

## 2020-04-30 DIAGNOSIS — L570 Actinic keratosis: Secondary | ICD-10-CM

## 2020-04-30 NOTE — Progress Notes (Signed)
° °  Follow-Up Visit   Subjective  Alexandra Dennis is a 70 y.o. female who presents for the following: Follow-up (Suture removal and new bx left middle finger ).   The following portions of the chart were reviewed this encounter and updated as appropriate:      Objective  Well appearing patient in no apparent distress; mood and affect are within normal limits.  A focused examination was performed including upper extremities. Relevant physical exam findings are noted in the Assessment and Plan.  Objective  Left Dorsal Mid 3rd Finger: Hyperkeratotic scale with pink base         Assessment & Plan  Neoplasm of uncertain behavior of skin Left Dorsal Mid 3rd Finger  Skin / nail biopsy Type of biopsy: tangential   Informed consent: discussed and consent obtained   Timeout: patient name, date of birth, surgical site, and procedure verified   Procedure prep:  Patient was prepped and draped in usual sterile fashion (Non sterile) Prep type:  Chlorhexidine Anesthesia: the lesion was anesthetized in a standard fashion   Anesthetic:  1% lidocaine w/ epinephrine 1-100,000 local infiltration Instrument used: flexible razor blade   Outcome: patient tolerated procedure well   Post-procedure details: wound care instructions given    Specimen 1 - Surgical pathology Differential Diagnosis: R/O BCC vs SCC  Check Margins: No    I, Mariusz Jubb, PA-C, have reviewed all documentation's for this visit.  The documentation on 04/30/20 for the exam, diagnosis, procedures and orders are all accurate and complete.

## 2020-04-30 NOTE — Patient Instructions (Signed)

## 2020-05-05 ENCOUNTER — Telehealth: Payer: Self-pay

## 2020-05-05 NOTE — Telephone Encounter (Signed)
-----   Message from Warren Danes, [place] sent at 05/05/2020  9:24 AM EST ----- Inform path. Rtc if recurs

## 2020-05-05 NOTE — Telephone Encounter (Signed)
Phone call to patient with her pathology results.  Patient aware of path.

## 2020-05-06 ENCOUNTER — Ambulatory Visit: Payer: 59 | Admitting: Physician Assistant

## 2020-05-12 ENCOUNTER — Encounter: Payer: Self-pay | Admitting: Physician Assistant

## 2020-05-12 NOTE — Progress Notes (Signed)
   Follow-Up Visit   Subjective  Alexandra Dennis is a 70 y.o. female who presents for the following: Procedure (Here for treatment right elbow- lateral - scc x 1 & check left middle finer x months- + itch).   The following portions of the chart were reviewed this encounter and updated as appropriate:      Objective  Well appearing patient in no apparent distress; mood and affect are within normal limits.  A focused examination was performed including left arm. Relevant physical exam findings are noted in the Assessment and Plan.  Objective  Right Elbow - lateral: Lesion identified by Robyne Askew and nurse in room.  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  Skin excision  Margin per side (cm):  2 Total excision diameter (cm):  4.5 Informed consent: discussed and consent obtained   Timeout: patient name, date of birth, surgical site, and procedure verified   Anesthesia: the lesion was anesthetized in a standard fashion   Anesthetic:  1% lidocaine w/ epinephrine 1-100,000 local infiltration Instrument used: #15 blade   Hemostasis achieved with: pressure and electrodesiccation   Outcome: patient tolerated procedure well with no complications   Post-procedure details: sterile dressing applied and wound care instructions given   Dressing type: bandage, petrolatum and pressure dressing   Additional details:  4-0 Vicryl x 4 4-0 Ethilon x 7  Specimen 1 - Surgical pathology Differential Diagnosis: R/O SCC  Check Margins: Yes (Anterior Margin Stained)  [id number]  Lesion identified by Robyne Askew and nurse in room.    I, Bettye Sitton, PA-C, have reviewed all documentation's for this visit.  The documentation on 05/12/20 for the exam, diagnosis, procedures and orders are all accurate and complete.

## 2020-06-25 ENCOUNTER — Encounter: Payer: Self-pay | Admitting: Physician Assistant

## 2020-06-25 ENCOUNTER — Other Ambulatory Visit: Payer: Self-pay

## 2020-06-25 ENCOUNTER — Ambulatory Visit (INDEPENDENT_AMBULATORY_CARE_PROVIDER_SITE_OTHER): Payer: 59 | Admitting: Physician Assistant

## 2020-06-25 DIAGNOSIS — Z85828 Personal history of other malignant neoplasm of skin: Secondary | ICD-10-CM

## 2020-06-25 DIAGNOSIS — B001 Herpesviral vesicular dermatitis: Secondary | ICD-10-CM

## 2020-06-25 DIAGNOSIS — Z8589 Personal history of malignant neoplasm of other organs and systems: Secondary | ICD-10-CM

## 2020-06-25 MED ORDER — VALACYCLOVIR HCL 500 MG PO TABS: 500.0000 mg | ORAL_TABLET | Freq: Every day | ORAL | 2 refills | Status: AC

## 2020-07-09 ENCOUNTER — Encounter: Payer: Self-pay | Admitting: Physician Assistant

## 2020-07-09 NOTE — Progress Notes (Signed)
   Follow-Up Visit   Subjective  Alexandra Dennis is a 70 y.o. female who presents for the following: Follow-up (6 month- right elbow lateral- healing good no concerns).   The following portions of the chart were reviewed this encounter and updated as appropriate:  Tobacco  Allergies  Meds  Problems  Med Hx  Surg Hx  Fam Hx      Objective  Well appearing patient in no apparent distress; mood and affect are within normal limits.  A full examination was performed including scalp, head, eyes, ears, nose, lips, neck, chest, axillae, abdomen, back, buttocks, bilateral upper extremities, bilateral lower extremities, hands, feet, fingers, toes, fingernails, and toenails. All findings within normal limits unless otherwise noted below.  Objective  Left Upper Vermilion Lip, Mid Lower Vermilion Lip, Right Lower [place] Lip: Blistering lips.  Objective  Head to toe: No atypical nevi No signs of non-mole skin cancer. Scar from excision is clear.    Assessment & Plan  Fever blister (3) Left Upper [place]ilion Lip; Right Lower Vermilion Lip; Mid Lower Vermilion Lip  valACYclovir (VALTREX) 500 MG tablet - Left Upper Vermilion Lip, Mid Lower Vermilion Lip, Right Lower Vermilion Lip  History of squamous cell carcinoma Head to toe  Patient will follow up in 6 months for skin exam.     I, Christien Berthelot, PA-C, have reviewed all documentation's for this visit.  The documentation on 07/09/20 for the exam, diagnosis, procedures and orders are all accurate and complete.

## 2020-08-01 ENCOUNTER — Other Ambulatory Visit: Payer: Self-pay | Admitting: Internal Medicine

## 2020-08-01 DIAGNOSIS — E785 Hyperlipidemia, unspecified: Secondary | ICD-10-CM

## 2020-08-20 ENCOUNTER — Other Ambulatory Visit: Payer: Self-pay

## 2020-08-20 ENCOUNTER — Ambulatory Visit (HOSPITAL_COMMUNITY)
Admission: RE | Admit: 2020-08-20 | Discharge: 2020-08-20 | Disposition: A | Discharge status: Home / Self Care | Payer: 59 | Source: Ambulatory Visit | Attending: Internal Medicine | Admitting: Internal Medicine

## 2020-08-20 DIAGNOSIS — E785 Hyperlipidemia, unspecified: Secondary | ICD-10-CM | POA: Insufficient documentation

## 2020-09-09 ENCOUNTER — Telehealth (HOSPITAL_COMMUNITY): Payer: Self-pay

## 2020-09-09 ENCOUNTER — Other Ambulatory Visit (HOSPITAL_COMMUNITY): Payer: Self-pay | Admitting: Interventional Radiology

## 2020-09-09 NOTE — Telephone Encounter (Signed)
Called to schedule mri/mra, no answer, left vm. AW  

## 2020-09-15 ENCOUNTER — Other Ambulatory Visit (HOSPITAL_COMMUNITY): Payer: Self-pay | Admitting: Interventional Radiology

## 2020-09-15 DIAGNOSIS — I671 Cerebral aneurysm, nonruptured: Secondary | ICD-10-CM

## 2020-09-29 ENCOUNTER — Other Ambulatory Visit: Payer: Self-pay

## 2020-09-29 ENCOUNTER — Ambulatory Visit (HOSPITAL_COMMUNITY): Admission: RE | Admit: 2020-09-29 | Payer: 59 | Source: Ambulatory Visit

## 2020-09-29 ENCOUNTER — Encounter (HOSPITAL_COMMUNITY): Payer: Self-pay

## 2020-09-29 ENCOUNTER — Ambulatory Visit (HOSPITAL_COMMUNITY)
Admission: RE | Admit: 2020-09-29 | Discharge: 2020-09-29 | Disposition: A | Discharge status: Home / Self Care | Payer: 59 | Source: Ambulatory Visit | Attending: Interventional Radiology | Admitting: Interventional Radiology

## 2020-09-29 DIAGNOSIS — I671 Cerebral aneurysm, nonruptured: Secondary | ICD-10-CM | POA: Insufficient documentation

## 2020-10-02 ENCOUNTER — Telehealth (HOSPITAL_COMMUNITY): Payer: Self-pay

## 2020-10-02 NOTE — Telephone Encounter (Signed)
Pt agreed to f/u in 1 year with mri brain w/wo, mra head wo. AW

## 2020-10-02 NOTE — Telephone Encounter (Signed)
Called pt regarding recent imaging, no answer, left vm. AW  

## 2020-12-19 ENCOUNTER — Other Ambulatory Visit: Payer: Self-pay | Admitting: Internal Medicine

## 2020-12-19 DIAGNOSIS — Z1231 Encounter for screening mammogram for malignant neoplasm of breast: Secondary | ICD-10-CM

## 2020-12-25 ENCOUNTER — Ambulatory Visit: Payer: 59 | Admitting: Physician Assistant

## 2021-01-01 ENCOUNTER — Other Ambulatory Visit: Payer: Self-pay

## 2021-01-01 ENCOUNTER — Ambulatory Visit (INDEPENDENT_AMBULATORY_CARE_PROVIDER_SITE_OTHER): Payer: 59 | Admitting: Physician Assistant

## 2021-01-01 DIAGNOSIS — Z1283 Encounter for screening for malignant neoplasm of skin: Secondary | ICD-10-CM | POA: Diagnosis not present

## 2021-01-01 DIAGNOSIS — Z85828 Personal history of other malignant neoplasm of skin: Secondary | ICD-10-CM | POA: Diagnosis not present

## 2021-01-01 DIAGNOSIS — Z85840 Personal history of malignant neoplasm of eye: Secondary | ICD-10-CM | POA: Diagnosis not present

## 2021-01-01 DIAGNOSIS — L820 Inflamed seborrheic keratosis: Secondary | ICD-10-CM | POA: Diagnosis not present

## 2021-01-06 ENCOUNTER — Encounter: Payer: Self-pay | Admitting: Physician Assistant

## 2021-01-06 NOTE — Progress Notes (Signed)
   Follow-Up Visit   Subjective  Alexandra Dennis is a 70 y.o. female who presents for the following: Annual Exam (Here for 6 month skin exam. Concerns right chest. Patient thinks its a keratosis. History of non mole skin cancers and history of Occular melanoma. ). She is getting injections in her eye. Everything is going well.    The following portions of the chart were reviewed this encounter and updated as appropriate:  Tobacco  Allergies  Meds  Problems  Med Hx  Surg Hx  Fam Hx      Objective  Well appearing patient in no apparent distress; mood and affect are within normal limits.  A full examination was performed including scalp, head, eyes, ears, nose, lips, neck, chest, axillae, abdomen, back, buttocks, bilateral upper extremities, bilateral lower extremities, hands, feet, fingers, toes, fingernails, and toenails. All findings within normal limits unless otherwise noted below.  Torso - Posterior (Back) (10) Erythematous stuck-on, crusty plaques.    Assessment & Plan  Seborrheic keratosis, inflamed Torso - Posterior (Back)  Destruction of lesion - Torso - Posterior (Back) Complexity: simple   Destruction method: cryotherapy   Informed consent: discussed and consent obtained   Timeout:  patient name, date of birth, surgical site, and procedure verified Lesion destroyed using liquid nitrogen: Yes   Cryotherapy cycles:  3 Outcome: patient tolerated procedure well with no complications   Post-procedure details: wound care instructions given      I, Aynsley Fleet, PA-C, have reviewed all documentation's for this visit.  The documentation on 01/06/21 for the exam, diagnosis, procedures and orders are all accurate and complete.

## 2021-01-13 ENCOUNTER — Other Ambulatory Visit: Payer: Self-pay | Admitting: Internal Medicine

## 2021-01-13 DIAGNOSIS — C6992 Malignant neoplasm of unspecified site of left eye: Secondary | ICD-10-CM

## 2021-01-28 ENCOUNTER — Other Ambulatory Visit: Payer: Self-pay | Admitting: Internal Medicine

## 2021-01-28 ENCOUNTER — Ambulatory Visit
Admission: RE | Admit: 2021-01-28 | Discharge: 2021-01-28 | Disposition: A | Discharge status: Home / Self Care | Payer: 59 | Source: Ambulatory Visit | Attending: Internal Medicine | Admitting: Internal Medicine

## 2021-01-28 DIAGNOSIS — C6992 Malignant neoplasm of unspecified site of left eye: Secondary | ICD-10-CM

## 2021-02-02 ENCOUNTER — Ambulatory Visit
Admission: RE | Admit: 2021-02-02 | Discharge: 2021-02-02 | Disposition: A | Discharge status: Home / Self Care | Payer: 59 | Source: Ambulatory Visit | Attending: Internal Medicine | Admitting: Internal Medicine

## 2021-02-02 DIAGNOSIS — Z1231 Encounter for screening mammogram for malignant neoplasm of breast: Secondary | ICD-10-CM

## 2021-07-02 ENCOUNTER — Encounter: Payer: Self-pay | Admitting: Physician Assistant

## 2021-07-02 ENCOUNTER — Ambulatory Visit (INDEPENDENT_AMBULATORY_CARE_PROVIDER_SITE_OTHER): Payer: 59 | Admitting: Physician Assistant

## 2021-07-02 DIAGNOSIS — Z1283 Encounter for screening for malignant neoplasm of skin: Secondary | ICD-10-CM | POA: Diagnosis not present

## 2021-07-02 DIAGNOSIS — L820 Inflamed seborrheic keratosis: Secondary | ICD-10-CM

## 2021-07-02 DIAGNOSIS — B359 Dermatophytosis, unspecified: Secondary | ICD-10-CM

## 2021-07-02 DIAGNOSIS — Z85828 Personal history of other malignant neoplasm of skin: Secondary | ICD-10-CM

## 2021-07-02 DIAGNOSIS — Z85840 Personal history of malignant neoplasm of eye: Secondary | ICD-10-CM

## 2021-07-02 MED ORDER — ALCLOMETASONE DIPROPIONATE 0.05 % EX CREA: TOPICAL_CREAM | Freq: Two times a day (BID) | CUTANEOUS | 3 refills | Status: DC | PRN

## 2021-07-02 MED ORDER — KETOCONAZOLE 2 % EX CREA: 1.0000 "application " | TOPICAL_CREAM | Freq: Two times a day (BID) | CUTANEOUS | 10 refills | Status: DC

## 2021-07-02 NOTE — Progress Notes (Signed)
? ?  Follow-Up Visit ?  ?Subjective  ?Alexandra Dennis is a 71 y.o. female who presents for the following: Annual Exam (Here for 6 month skin exam. History of occular melanoma and nonmole skin cancer. Concerns irritation in both axillas. No changes in deodorant or anything else topical. Does not itch much. /No other concerns. ). ? ? ?The following portions of the chart were reviewed this encounter and updated as appropriate:  Tobacco  Allergies  Meds  Problems  Med Hx  Surg Hx  Fam Hx   ?  ? ?Objective  ?Well appearing patient in no apparent distress; mood and affect are within normal limits. ? ?A full examination was performed including scalp, head, eyes, ears, nose, lips, neck, chest, axillae, abdomen, back, buttocks, bilateral upper extremities, bilateral lower extremities, hands, feet, fingers, toes, fingernails, and toenails. All findings within normal limits unless otherwise noted below. ? ?No atypical nevi or signs of NMSC noted at the time of the visit. ? ?Left Forearm - Anterior, Left Shoulder - Anterior (2), Left Thigh - Anterior, Left Thigh - Posterior, Mid Back (4), Right Forearm - Anterior, Right Popliteal Fossa, Right Thigh - Anterior, Right Upper Arm - Anterior ?Irritated & inflamed stuck-on, waxy, tan-brown papules and plaques. --Discussed benign etiology and prognosis.  ? ?Left Axilla ?Oval, diffuse erythema without scale. ? ? ?Assessment & Plan  ?Encounter for screening for malignant neoplasm of skin ? ?Biannual skin examination.  ? ?Seborrheic keratosis, inflamed (13) ?Left Shoulder - Anterior (2); Right Popliteal Fossa; Right Upper Arm - Anterior; Left Forearm - Anterior; Right Forearm - Anterior; Left Thigh - Anterior; Right Thigh - Anterior; Left Thigh - Posterior; Mid Back (4) ? ?Destruction of lesion - Left Forearm - Anterior, Left Shoulder - Anterior, Left Thigh - Anterior, Left Thigh - Posterior, Mid Back, Right Forearm - Anterior, Right Popliteal Fossa, Right Thigh - Anterior, Right  Upper Arm - Anterior ?Complexity: simple   ?Destruction method: cryotherapy   ?Informed consent: discussed and consent obtained   ?Timeout:  patient name, date of birth, surgical site, and procedure verified ?Lesion destroyed using liquid nitrogen: Yes   ?Cryotherapy cycles:  3 ?Outcome: patient tolerated procedure well with no complications   ? ?Tinea ?Left Axilla ? ?alclomethasone (ACLOVATE) 0.05 % cream - Left Axilla ?Apply topically 2 (two) times daily as needed (Rash). ? ?ketoconazole (NIZORAL) 2 % cream - Left Axilla ?Apply 1 application. topically in the morning and at bedtime. ? ? ? ?I, Glady Ouderkirk, PA-C, have reviewed all documentation's for this visit.  The documentation on 07/02/21 for the exam, diagnosis, procedures and orders are all accurate and complete. ?

## 2021-09-15 ENCOUNTER — Ambulatory Visit (INDEPENDENT_AMBULATORY_CARE_PROVIDER_SITE_OTHER): Payer: 59 | Admitting: Physician Assistant

## 2021-09-15 DIAGNOSIS — Z85828 Personal history of other malignant neoplasm of skin: Secondary | ICD-10-CM | POA: Diagnosis not present

## 2021-09-15 DIAGNOSIS — C44729 Squamous cell carcinoma of skin of left lower limb, including hip: Secondary | ICD-10-CM

## 2021-09-15 DIAGNOSIS — D485 Neoplasm of uncertain behavior of skin: Secondary | ICD-10-CM

## 2021-09-15 DIAGNOSIS — Z85820 Personal history of malignant melanoma of skin: Secondary | ICD-10-CM | POA: Diagnosis not present

## 2021-09-15 NOTE — Patient Instructions (Signed)

## 2021-09-21 ENCOUNTER — Telehealth: Payer: Self-pay | Admitting: Physician Assistant

## 2021-09-21 ENCOUNTER — Encounter: Payer: Self-pay | Admitting: Physician Assistant

## 2021-09-21 NOTE — Progress Notes (Signed)
   Follow-Up Visit   Subjective  Alexandra Dennis is a 71 y.o. female who presents for the following: Skin Problem (Has lesion left posterior thigh. History of non mole skin cancer and melanoma. ).   The following portions of the chart were reviewed this encounter and updated as appropriate:  Tobacco  Allergies  Meds  Problems  Med Hx  Surg Hx  Fam Hx      Objective  Well appearing patient in no apparent distress; mood and affect are within normal limits.  A focused examination was performed including left leg. Relevant physical exam findings are noted in the Assessment and Plan.  Left Thigh - Posterior Volcano growth on pink base         Assessment & Plan  SCC (squamous cell carcinoma), leg, left Left Thigh - Posterior  Skin / nail biopsy Type of biopsy: tangential   Informed consent: discussed and consent obtained   Timeout: patient name, date of birth, surgical site, and procedure verified   Anesthesia: the lesion was anesthetized in a standard fashion   Anesthetic:  1% lidocaine w/ epinephrine 1-100,000 local infiltration Instrument used: flexible razor blade   Hemostasis achieved with: ferric subsulfate   Outcome: patient tolerated procedure well   Post-procedure details: wound care instructions given    Destruction of lesion Complexity: simple   Destruction method: electrodesiccation and curettage   Informed consent: discussed and consent obtained   Timeout:  patient name, date of birth, surgical site, and procedure verified Anesthesia: the lesion was anesthetized in a standard fashion   Anesthetic:  1% lidocaine w/ epinephrine 1-100,000 local infiltration Curettage performed in three different directions: Yes   Electrodesiccation performed over the curetted area: Yes   Curettage cycles:  3 Margin per side (cm):  0.1 Final wound size (cm):  1 Hemostasis achieved with:  aluminum chloride Outcome: patient tolerated procedure well with no complications    Post-procedure details: wound care instructions given    Specimen 1 - Surgical pathology Differential Diagnosis: KA, scc vs bcc  Check Margins: No    I, Jessamyn Watterson, PA-C, have reviewed all documentation's for this visit.  The documentation on 09/21/21 for the exam, diagnosis, procedures and orders are all accurate and complete.

## 2021-09-21 NOTE — Telephone Encounter (Signed)
I called patient to give her results. Treated it at the time of the visit. Will return to office if recurs for excision.

## 2021-10-05 IMAGING — MR MR MRA HEAD W/O CM
1 series · 23 of 48 positions shown · non-contrast
Comparison: June 26, 2019.

CLINICAL DATA: Aneurysm follow-up.

EXAM:
MRI HEAD WITHOUT CONTRAST
MRA HEAD WITHOUT CONTRAST
TECHNIQUE: Multiplanar, multi-echo pulse sequences of the brain and surrounding
structures were acquired without intravenous contrast. Angiographic
images of the Circle of Willis were acquired using MRA technique
without intravenous contrast.

[Series 9: 3d cow · axial · 0.5mm · 0.41mm/px · z∈[-104,-15]mm · 23 of 188 slices shown]
[im 1/188]
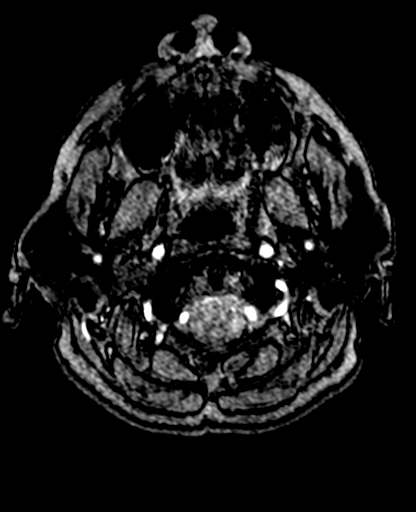
[im 4/188]
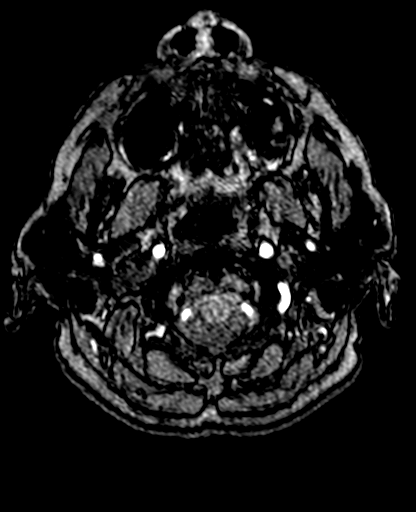
[im 8/188]
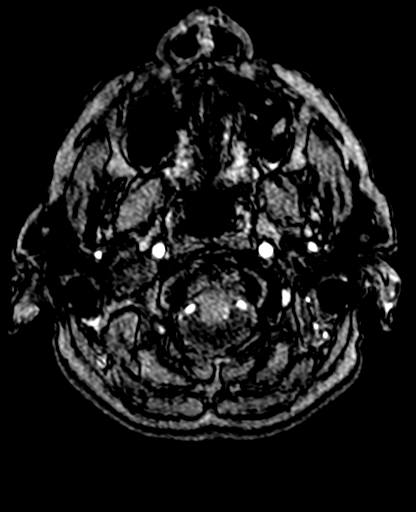
[im 12/188]
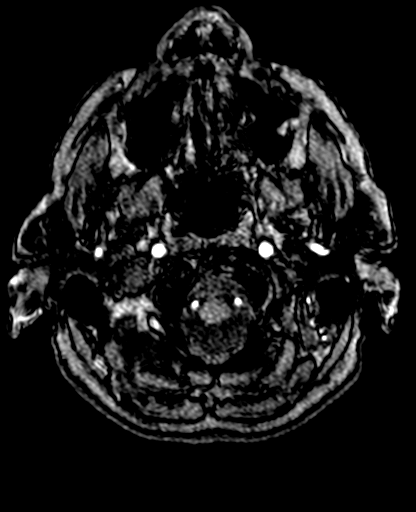
[im 16/188]
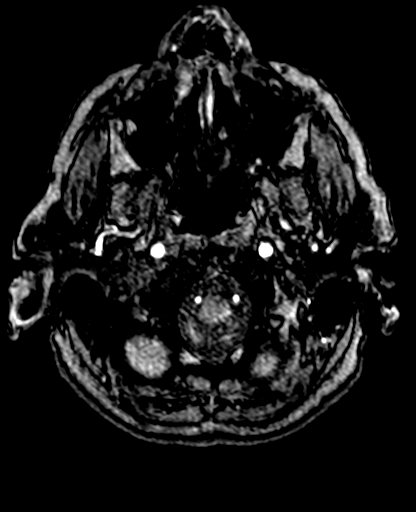
[im 20/188]
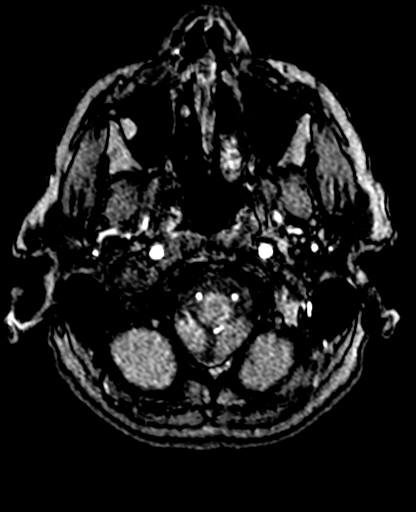
[im 24/188]
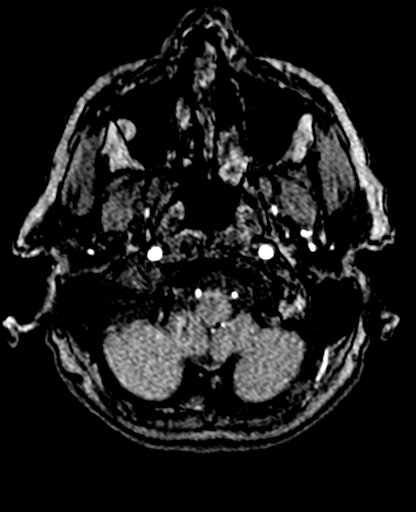
[im 28/188]
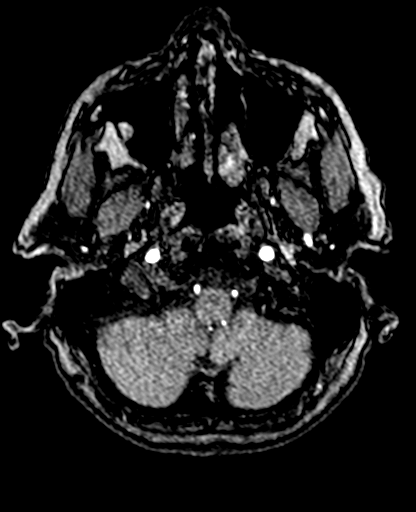
[im 32/188]
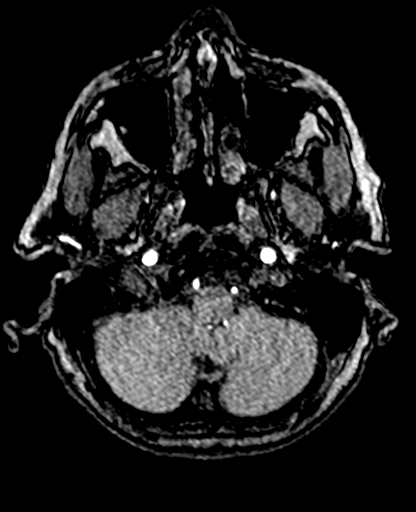
[im 36/188]
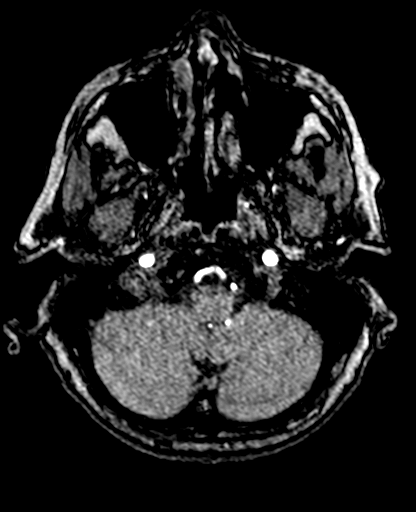
[im 40/188]
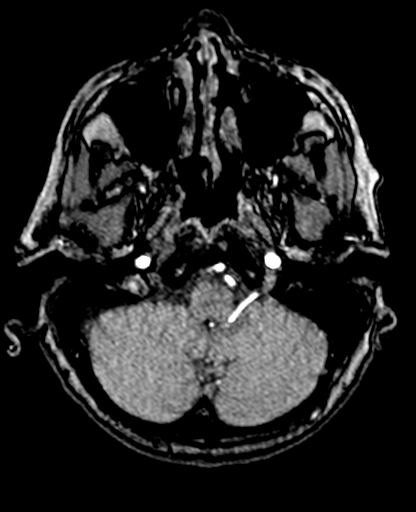
[im 44/188]
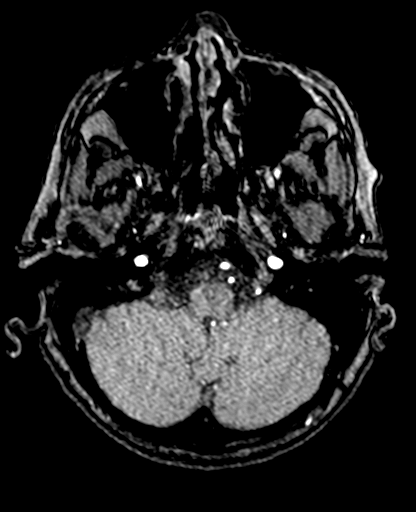
[im 48/188]
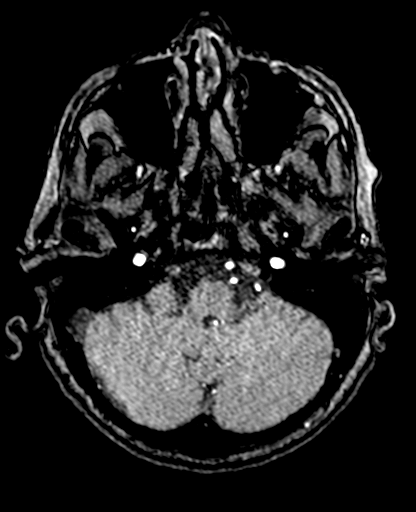
[im 52/188]
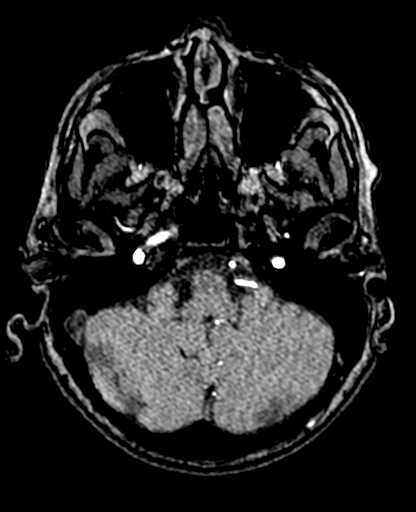
[im 56/188]
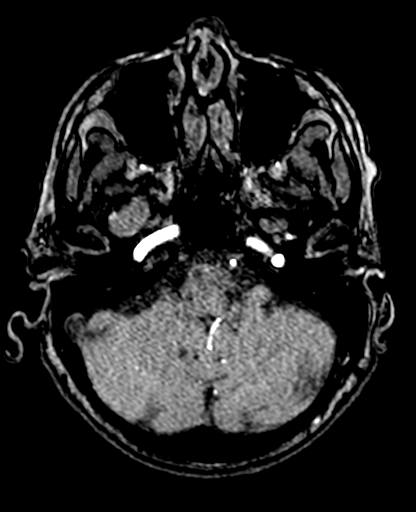
[im 60/188]
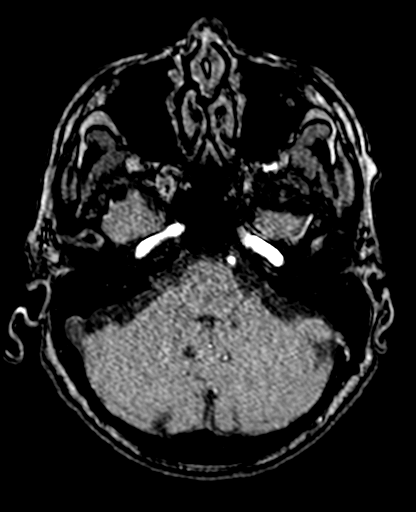
[im 84/188]
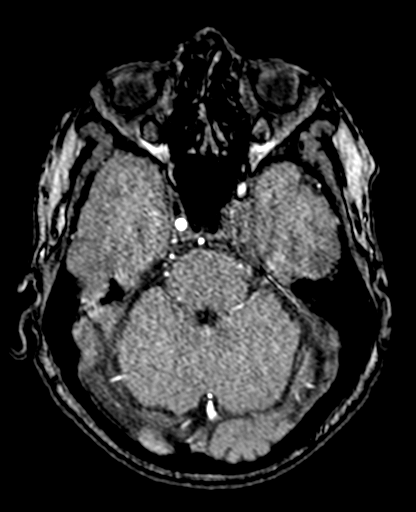
[im 96/188]
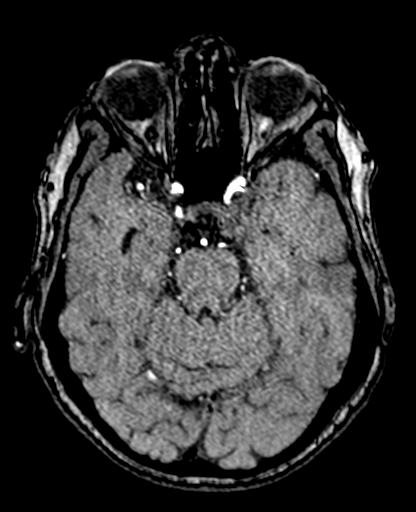
[im 108/188]
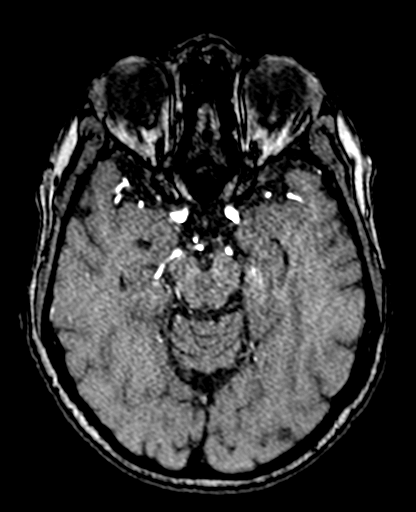
[im 132/188]
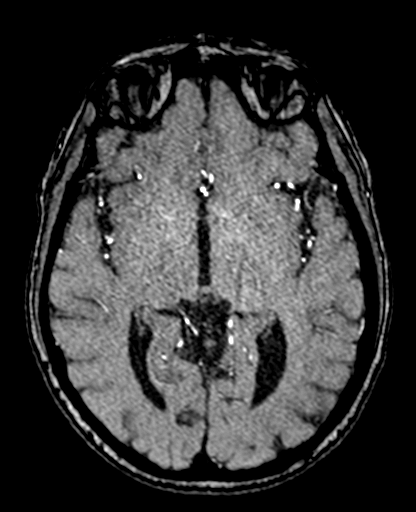
[im 156/188]
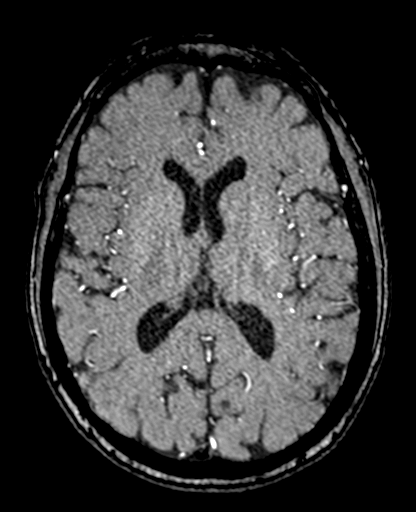
[im 160/188]
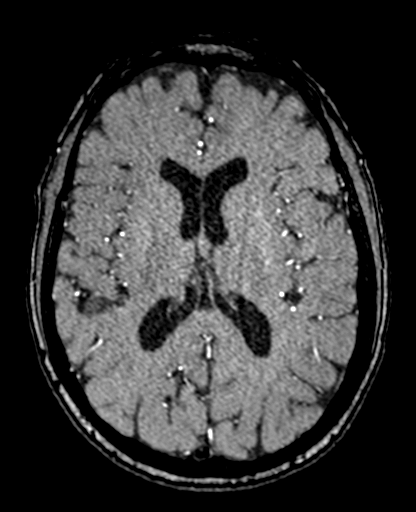
[im 180/188]
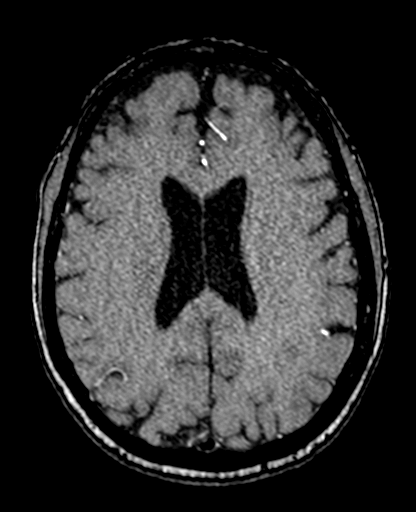

[23 of 48 positions shown; findings below may reference images not displayed]

FINDINGS: MRI HEAD FINDINGS

Brain: No acute infarction, hemorrhage, hydrocephalus, extra-axial
collection or mass lesion. Mild scattered T2/FLAIR hyperintensities
within the white matter, nonspecific but most likely related to mild
chronic microvascular ischemic disease that is not necessarily
abnormal for age.

Vascular: Similar size of the treated left cavernous ICA aneurysm,
measuring approximately 1.5 by 1.4 cm with heterogeneous T2
hyperintensity internally compatible with thrombus. Please see MRA
portion below for further evaluation.

Skull and upper cervical spine: Normal marrow signal.

Sinuses/Orbits: Mild paranasal sinus mucosal thickening.
Unremarkable orbits.

Other: No sizable mastoid effusions.

MRA HEAD FINDINGS

Anterior circulation: Bilateral intracranial ICAs are patent. There
is similar mildly reduced flow related signal in the left cavernous
segment secondary to pipeline stent. An approximately 3 mm focus of
flow related signal near the base of the aneurysm is similar to
prior and presumably reflects mild residual flow given no intrinsic
T1 hyperintensity in this region. Bilateral MCAs and ACAs are patent
without evidence of a proximal hemodynamically significant stenosis.
No new intracranial aneurysm identified.

Posterior circulation: Bilateral intradural vertebral arteries,
basilar artery, and posterior cerebral arteries are patent without
evidence of a proximal in amic be significant stenosis. Similar
prominent bilateral posterior communicating arteries with absent
left P1 PCA, anatomic variant.

Anatomic variants: As detailed above.
IMPRESSION: 1. Unchanged size of the treated left cavernous ICA aneurysm, which
is largely thrombosed. Similar mild residual flow near the base of
the aneurysm on the MRA.
2. No evidence of acute intracranial abnormality.

## 2021-10-15 ENCOUNTER — Other Ambulatory Visit (HOSPITAL_COMMUNITY): Payer: Self-pay | Admitting: Interventional Radiology

## 2021-10-15 DIAGNOSIS — I671 Cerebral aneurysm, nonruptured: Secondary | ICD-10-CM

## 2021-11-23 ENCOUNTER — Ambulatory Visit (HOSPITAL_COMMUNITY)
Admission: RE | Admit: 2021-11-23 | Discharge: 2021-11-23 | Disposition: A | Discharge status: Home / Self Care | Payer: 59 | Source: Ambulatory Visit | Attending: Interventional Radiology | Admitting: Interventional Radiology

## 2021-11-23 DIAGNOSIS — I671 Cerebral aneurysm, nonruptured: Secondary | ICD-10-CM | POA: Insufficient documentation

## 2021-11-23 MED ORDER — GADOBUTROL 1 MMOL/ML IV SOLN
6.0000 mL | Freq: Once | INTRAVENOUS | Status: AC | PRN
  Administered 2021-11-23: 6 mL via INTRAVENOUS

## 2021-11-26 ENCOUNTER — Other Ambulatory Visit (HOSPITAL_COMMUNITY): Payer: Self-pay | Admitting: Interventional Radiology

## 2021-11-26 DIAGNOSIS — I671 Cerebral aneurysm, nonruptured: Secondary | ICD-10-CM

## 2021-12-09 ENCOUNTER — Ambulatory Visit (HOSPITAL_COMMUNITY): Payer: 59

## 2021-12-15 ENCOUNTER — Ambulatory Visit (HOSPITAL_COMMUNITY)
Admission: RE | Admit: 2021-12-15 | Discharge: 2021-12-15 | Disposition: A | Discharge status: Home / Self Care | Payer: 59 | Source: Ambulatory Visit | Attending: Interventional Radiology | Admitting: Interventional Radiology

## 2021-12-15 DIAGNOSIS — I671 Cerebral aneurysm, nonruptured: Secondary | ICD-10-CM

## 2021-12-17 ENCOUNTER — Ambulatory Visit (HOSPITAL_COMMUNITY): Payer: 59

## 2021-12-17 HISTORY — PX: IR RADIOLOGIST EVAL & MGMT: IMG5224

## 2021-12-31 ENCOUNTER — Telehealth (HOSPITAL_COMMUNITY): Payer: Self-pay | Admitting: Orthopedic Surgery

## 2021-12-31 MED ORDER — CELECOXIB 100 MG PO CAPS: 100.0000 mg | ORAL_CAPSULE | Freq: Two times a day (BID) | ORAL | 0 refills | Status: AC

## 2021-12-31 MED ORDER — ONDANSETRON HCL 4 MG PO TABS: 4.0000 mg | ORAL_TABLET | Freq: Three times a day (TID) | ORAL | 0 refills | Status: AC | PRN

## 2021-12-31 MED ORDER — OXYCODONE HCL 5 MG PO TABS: 5.0000 mg | ORAL_TABLET | ORAL | 0 refills | Status: AC | PRN

## 2021-12-31 MED ORDER — METHOCARBAMOL 500 MG PO TABS: 500.0000 mg | ORAL_TABLET | Freq: Three times a day (TID) | ORAL | 0 refills | Status: AC | PRN

## 2021-12-31 NOTE — Telephone Encounter (Signed)
Post op meds for TKA surgery sent.

## 2022-01-05 ENCOUNTER — Other Ambulatory Visit: Payer: Self-pay | Admitting: Internal Medicine

## 2022-01-05 ENCOUNTER — Ambulatory Visit: Payer: 59 | Admitting: Physician Assistant

## 2022-01-05 DIAGNOSIS — E2839 Other primary ovarian failure: Secondary | ICD-10-CM

## 2022-01-13 ENCOUNTER — Ambulatory Visit
Admission: RE | Admit: 2022-01-13 | Discharge: 2022-01-13 | Disposition: A | Discharge status: Home / Self Care | Payer: 59 | Source: Ambulatory Visit | Attending: Internal Medicine | Admitting: Internal Medicine

## 2022-01-13 DIAGNOSIS — E2839 Other primary ovarian failure: Secondary | ICD-10-CM

## 2022-01-19 ENCOUNTER — Other Ambulatory Visit: Payer: Self-pay | Admitting: Internal Medicine

## 2022-01-19 DIAGNOSIS — C6992 Malignant neoplasm of unspecified site of left eye: Secondary | ICD-10-CM

## 2022-02-19 ENCOUNTER — Other Ambulatory Visit: Payer: Self-pay | Admitting: Internal Medicine

## 2022-02-19 ENCOUNTER — Ambulatory Visit
Admission: RE | Admit: 2022-02-19 | Discharge: 2022-02-19 | Disposition: A | Discharge status: Home / Self Care | Payer: 59 | Source: Ambulatory Visit | Attending: Internal Medicine | Admitting: Internal Medicine

## 2022-02-19 DIAGNOSIS — C6992 Malignant neoplasm of unspecified site of left eye: Secondary | ICD-10-CM

## 2022-03-24 ENCOUNTER — Other Ambulatory Visit: Payer: 59

## 2022-03-25 ENCOUNTER — Other Ambulatory Visit: Payer: Self-pay | Admitting: Internal Medicine

## 2022-03-25 DIAGNOSIS — Z1231 Encounter for screening mammogram for malignant neoplasm of breast: Secondary | ICD-10-CM

## 2022-04-28 ENCOUNTER — Telehealth (HOSPITAL_COMMUNITY): Payer: Self-pay

## 2022-04-28 NOTE — Telephone Encounter (Signed)
Returned pt's call and gave her the number to [facility] billing for her billing questions. AB

## 2022-05-06 ENCOUNTER — Ambulatory Visit
Admission: RE | Admit: 2022-05-06 | Discharge: 2022-05-06 | Disposition: A | Discharge status: Home / Self Care | Payer: Managed Care, Other (non HMO) | Source: Ambulatory Visit | Attending: Internal Medicine | Admitting: Internal Medicine

## 2022-05-06 DIAGNOSIS — Z1231 Encounter for screening mammogram for malignant neoplasm of breast: Secondary | ICD-10-CM

## 2023-01-05 ENCOUNTER — Other Ambulatory Visit (HOSPITAL_COMMUNITY): Payer: Self-pay | Admitting: Interventional Radiology

## 2023-01-05 ENCOUNTER — Telehealth (HOSPITAL_COMMUNITY): Payer: Self-pay

## 2023-01-05 DIAGNOSIS — I671 Cerebral aneurysm, nonruptured: Secondary | ICD-10-CM

## 2023-01-05 NOTE — Telephone Encounter (Signed)
Called to schedule mri, no answer, left vm. AB  

## 2023-01-16 ENCOUNTER — Emergency Department (HOSPITAL_COMMUNITY): Payer: 59

## 2023-01-16 ENCOUNTER — Observation Stay (HOSPITAL_COMMUNITY)
Admission: EM | Admit: 2023-01-16 | Discharge: 2023-01-18 | Disposition: A | Discharge status: Home / Self Care | Payer: 59 | Attending: Emergency Medicine | Admitting: Emergency Medicine

## 2023-01-16 DIAGNOSIS — Z85828 Personal history of other malignant neoplasm of skin: Secondary | ICD-10-CM | POA: Diagnosis not present

## 2023-01-16 DIAGNOSIS — R1011 Right upper quadrant pain: Principal | ICD-10-CM | POA: Diagnosis present

## 2023-01-16 DIAGNOSIS — Z87891 Personal history of nicotine dependence: Secondary | ICD-10-CM | POA: Diagnosis not present

## 2023-01-16 DIAGNOSIS — J45909 Unspecified asthma, uncomplicated: Secondary | ICD-10-CM | POA: Diagnosis not present

## 2023-01-16 DIAGNOSIS — Z79899 Other long term (current) drug therapy: Secondary | ICD-10-CM | POA: Diagnosis not present

## 2023-01-16 DIAGNOSIS — K810 Acute cholecystitis: Secondary | ICD-10-CM | POA: Diagnosis present

## 2023-01-16 DIAGNOSIS — K8000 Calculus of gallbladder with acute cholecystitis without obstruction: Secondary | ICD-10-CM | POA: Diagnosis not present

## 2023-01-16 DIAGNOSIS — E039 Hypothyroidism, unspecified: Secondary | ICD-10-CM | POA: Diagnosis not present

## 2023-01-16 DIAGNOSIS — Z7982 Long term (current) use of aspirin: Secondary | ICD-10-CM | POA: Insufficient documentation

## 2023-01-16 LAB — COMPREHENSIVE METABOLIC PANEL
ALT: 19 U/L (ref 0–44)
AST: 19 U/L (ref 15–41)
Albumin: 4.5 g/dL (ref 3.5–5.0)
Alkaline Phosphatase: 75 U/L (ref 38–126)
Anion gap: 11 (ref 5–15)
BUN: 16 mg/dL (ref 8–23)
CO2: 21 mmol/L — ABNORMAL LOW (ref 22–32)
Calcium: 9 mg/dL (ref 8.9–10.3)
Chloride: 104 mmol/L (ref 98–111)
Creatinine, Ser: 0.84 mg/dL (ref 0.44–1.00)
GFR, Estimated: >60 mL/min (ref >60)
Glucose, Bld: 146 mg/dL — ABNORMAL HIGH (ref 70–99)
Potassium: 3.9 mmol/L (ref 3.5–5.1)
Sodium: 136 mmol/L (ref 135–145)
Total Bilirubin: 0.7 mg/dL (ref <1.2)
Total Protein: 7.3 g/dL (ref 6.5–8.1)

## 2023-01-16 LAB — CBC
HCT: 40.5 % (ref 36.0–46.0)
Hemoglobin: 13.4 g/dL (ref 12.0–15.0)
MCH: 30 pg (ref 26.0–34.0)
MCHC: 33.1 g/dL (ref 30.0–36.0)
MCV: 90.8 fL (ref 80.0–100.0)
Platelets: 344 10*3/uL (ref 150–400)
RBC: 4.46 MIL/uL (ref 3.87–5.11)
RDW: 12.4 % (ref 11.5–15.5)
WBC: 12.4 10*3/uL — ABNORMAL HIGH (ref 4.0–10.5)
nRBC: 0 % (ref 0.0–0.2)

## 2023-01-16 LAB — URINALYSIS, ROUTINE W REFLEX MICROSCOPIC
Bacteria, UA: NONE SEEN
Bilirubin Urine: NEGATIVE
Glucose, UA: NEGATIVE mg/dL
Ketones, ur: 80 mg/dL — AB
Leukocytes,Ua: NEGATIVE
Nitrite: NEGATIVE
Protein, ur: 30 mg/dL — AB
Specific Gravity, Urine: 1.027 (ref 1.005–1.030)
pH: 5 (ref 5.0–8.0)

## 2023-01-16 LAB — LIPASE, BLOOD: Lipase: 44 U/L (ref 11–51)

## 2023-01-16 MED ORDER — HYDROMORPHONE HCL 1 MG/ML IJ SOLN
0.5000 mg | Freq: Once | INTRAMUSCULAR | Status: AC
  Administered 2023-01-16: 0.5 mg via INTRAVENOUS
  Filled 2023-01-16: qty 1

## 2023-01-16 MED ORDER — ONDANSETRON HCL 4 MG/2ML IJ SOLN
4.0000 mg | Freq: Once | INTRAMUSCULAR | Status: AC
  Administered 2023-01-16: 4 mg via INTRAVENOUS
  Filled 2023-01-16: qty 2

## 2023-01-16 NOTE — ED Triage Notes (Signed)
X 2pm today pt with URQ abd pain that radiates across abdomen, pt reports n/v today, pt denies any GI hx

## 2023-01-16 NOTE — ED Provider Notes (Signed)
[facility] Emergency Department Provider Note MRN:  [id number]  Arrival date & time: 01/17/23     Chief Complaint   Abdominal Pain   History of Present Illness   Alexandra Dennis is a 71 y.o. year-old female presents to the ED with chief complaint of RUQ abdominal pain that started today around 2pm and has been constant and worsening.  Hx of gallbladder pain.  States that this feels worse.  Still has her gall bladder.  Reports associated nausea and vomiting.  Denies any other new symptoms.  Denies fever or chills.  Rates pain as severe. Marland Kitchen  History provided by patient.   Review of Systems  Pertinent positive and negative review of systems noted in HPI.    Physical Exam   Vitals:   01/17/23 0100 01/17/23 0138  BP: 120/68 126/82  Pulse: 79 91  Resp:  18  Temp:  97.6 F (36.4 C)  SpO2: 97% 100%    CONSTITUTIONAL:  uncomfortable-appearing, NAD NEURO:  Alert and oriented x 3, CN 3-12 grossly intact EYES:  eyes equal and reactive ENT/NECK:  Supple, no stridor  CARDIO:  normal rate, regular rhythm, appears well-perfused  PULM:  No respiratory distress,  GI/GU:  non-distended, RUQ TTP MSK/SPINE:  No gross deformities, no edema, moves all extremities  SKIN:  no rash, atraumatic   *Additional and/or pertinent findings included in MDM below  Diagnostic and Interventional Summary    EKG Interpretation Date/Time:    Ventricular Rate:    PR Interval:    QRS Duration:    QT Interval:    QTC Calculation:   R Axis:      Text Interpretation:         Labs Reviewed  COMPREHENSIVE METABOLIC PANEL - Abnormal; Notable for the following components:      Result Value   CO2 21 (*)    Glucose, Bld 146 (*)    All other components within normal limits  CBC - Abnormal; Notable for the following components:   WBC 12.4 (*)    All other components within normal limits  URINALYSIS, ROUTINE W REFLEX MICROSCOPIC - Abnormal; Notable for the following  components:   APPearance HAZY (*)    Hgb urine dipstick MODERATE (*)    Ketones, ur 80 (*)    Protein, ur 30 (*)    All other components within normal limits  CBC - Abnormal; Notable for the following components:   WBC 12.2 (*)    All other components within normal limits  LIPASE, BLOOD  HIV ANTIBODY (ROUTINE TESTING W REFLEX)  COMPREHENSIVE METABOLIC PANEL    US Abdomen Limited RUQ (LIVER/GB)  Final Result    MR ABDOMEN MRCP W WO CONTAST    (Results Pending)    Medications  lactated ringers infusion ( Intravenous New Bag/Given 01/17/23 0200)  acetaminophen (TYLENOL) tablet 650 mg (has no administration in time range)    Or  acetaminophen (TYLENOL) suppository 650 mg (has no administration in time range)  HYDROmorphone (DILAUDID) injection 1 mg (1 mg Intravenous Given 01/17/23 0258)  ondansetron (ZOFRAN) tablet 4 mg (has no administration in time range)    Or  ondansetron (ZOFRAN) injection 4 mg (has no administration in time range)  senna-docusate (Senokot-S) tablet 1 tablet (has no administration in time range)  thyroid (ARMOUR) tablet 30 mg (has no administration in time range)  HYDROmorphone (DILAUDID) injection 0.5 mg (0.5 mg Intravenous Given 01/16/23 2247)  ondansetron (ZOFRAN) injection 4 mg (4 mg Intravenous Given 01/16/23  2247)  HYDROmorphone (DILAUDID) injection 0.5 mg (0.5 mg Intravenous Given 01/17/23 0020)     Procedures  /  Critical Care Procedures  ED Course and Medical Decision Making  I have reviewed the triage vital signs, the nursing notes, and pertinent available records from the EMR.  Social Determinants Affecting Complexity of Care: Patient has no clinically significant social determinants affecting this chief complaint..   ED Course:    Medical Decision Making Patient here with RUQ abdominal pain.  Concern for cholecystitis.  Will check RUQ [place].  Labs and imaging discussed below.    No urinary symptoms, doubt KS. Chronic cough, but know  changes.  Doubt pneumonia. No fever or chills.  Reassuring labs.  Doubt cholangitis. Lipase is normal, doubt pancreatitis.  Amount and/or Complexity of Data Reviewed Labs: ordered. Radiology: ordered.  Risk Prescription drug management. Decision regarding hospitalization.         Consultants: I consulted with Hospitalist, Dr. Allena Katz, who is appreciated for admitting.   Treatment and Plan: Patient's exam and diagnostic results are concerning for abdominal pain.  Feel that patient will need admission to the hospital for further treatment and evaluation.    Final Clinical Impressions(s) / ED Diagnoses     ICD-10-CM   1. RUQ abdominal pain  R10.11       ED Discharge Orders     None         Discharge Instructions Discussed with and Provided to Patient:   Discharge Instructions   None      Roxy Horseman, PA-C 01/17/23 0419    Nira Conn, MD 01/17/23 [phone]

## 2023-01-17 ENCOUNTER — Inpatient Hospital Stay (HOSPITAL_COMMUNITY): Payer: 59

## 2023-01-17 ENCOUNTER — Inpatient Hospital Stay (HOSPITAL_COMMUNITY): Payer: 59 | Admitting: Certified Registered Nurse Anesthetist

## 2023-01-17 ENCOUNTER — Observation Stay (HOSPITAL_COMMUNITY): Payer: 59

## 2023-01-17 ENCOUNTER — Encounter (HOSPITAL_COMMUNITY): Payer: Self-pay | Admitting: Internal Medicine

## 2023-01-17 ENCOUNTER — Telehealth (HOSPITAL_COMMUNITY): Payer: Self-pay

## 2023-01-17 ENCOUNTER — Encounter (HOSPITAL_COMMUNITY)
Admission: EM | Disposition: A | Discharge status: Home / Self Care | Payer: Self-pay | Source: Home / Self Care | Attending: Emergency Medicine

## 2023-01-17 ENCOUNTER — Other Ambulatory Visit: Payer: Self-pay

## 2023-01-17 DIAGNOSIS — E039 Hypothyroidism, unspecified: Secondary | ICD-10-CM | POA: Diagnosis present

## 2023-01-17 DIAGNOSIS — K810 Acute cholecystitis: Secondary | ICD-10-CM

## 2023-01-17 DIAGNOSIS — R1011 Right upper quadrant pain: Secondary | ICD-10-CM | POA: Diagnosis not present

## 2023-01-17 HISTORY — PX: CHOLECYSTECTOMY: SHX55

## 2023-01-17 LAB — CBC
HCT: 39.1 % (ref 36.0–46.0)
Hemoglobin: 12.6 g/dL (ref 12.0–15.0)
MCH: 29.9 pg (ref 26.0–34.0)
MCHC: 32.2 g/dL (ref 30.0–36.0)
MCV: 92.9 fL (ref 80.0–100.0)
Platelets: 309 10*3/uL (ref 150–400)
RBC: 4.21 MIL/uL (ref 3.87–5.11)
RDW: 12.5 % (ref 11.5–15.5)
WBC: 12.2 10*3/uL — ABNORMAL HIGH (ref 4.0–10.5)
nRBC: 0 % (ref 0.0–0.2)

## 2023-01-17 LAB — COMPREHENSIVE METABOLIC PANEL
ALT: 16 U/L (ref 0–44)
AST: 16 U/L (ref 15–41)
Albumin: 3.8 g/dL (ref 3.5–5.0)
Alkaline Phosphatase: 65 U/L (ref 38–126)
Anion gap: 7 (ref 5–15)
BUN: 15 mg/dL (ref 8–23)
CO2: 22 mmol/L (ref 22–32)
Calcium: 8.3 mg/dL — ABNORMAL LOW (ref 8.9–10.3)
Chloride: 104 mmol/L (ref 98–111)
Creatinine, Ser: 0.75 mg/dL (ref 0.44–1.00)
GFR, Estimated: >60 mL/min (ref >60)
Glucose, Bld: 137 mg/dL — ABNORMAL HIGH (ref 70–99)
Potassium: 3.7 mmol/L (ref 3.5–5.1)
Sodium: 133 mmol/L — ABNORMAL LOW (ref 135–145)
Total Bilirubin: 0.6 mg/dL (ref <1.2)
Total Protein: 6.6 g/dL (ref 6.5–8.1)

## 2023-01-17 LAB — HIV ANTIBODY (ROUTINE TESTING W REFLEX): HIV Screen 4th Generation wRfx: NONREACTIVE

## 2023-01-17 SURGERY — LAPAROSCOPIC CHOLECYSTECTOMY WITH INTRAOPERATIVE CHOLANGIOGRAM
Anesthesia: General

## 2023-01-17 MED ORDER — ROCURONIUM BROMIDE 10 MG/ML (PF) SYRINGE
PREFILLED_SYRINGE | INTRAVENOUS | Status: AC
  Filled 2023-01-17: qty 10

## 2023-01-17 MED ORDER — ALBUMIN HUMAN 5 % IV SOLN
INTRAVENOUS | Status: AC
  Filled 2023-01-17: qty 250

## 2023-01-17 MED ORDER — ONDANSETRON HCL 4 MG/2ML IJ SOLN: 4.0000 mg | Freq: Once | INTRAMUSCULAR | Status: DC | PRN

## 2023-01-17 MED ORDER — MELATONIN 3 MG PO TABS: 3.0000 mg | ORAL_TABLET | Freq: Every evening | ORAL | Status: DC | PRN

## 2023-01-17 MED ORDER — BUPIVACAINE-EPINEPHRINE 0.25% -1:200000 IJ SOLN
INTRAMUSCULAR | Status: AC
  Filled 2023-01-17: qty 1

## 2023-01-17 MED ORDER — BUPIVACAINE-EPINEPHRINE 0.25% -1:200000 IJ SOLN
INTRAMUSCULAR | Status: DC | PRN
  Administered 2023-01-17: 30 mL

## 2023-01-17 MED ORDER — ACETAMINOPHEN 325 MG PO TABS: 650.0000 mg | ORAL_TABLET | Freq: Four times a day (QID) | ORAL | Status: DC | PRN

## 2023-01-17 MED ORDER — ROCURONIUM BROMIDE 10 MG/ML (PF) SYRINGE
PREFILLED_SYRINGE | INTRAVENOUS | Status: DC | PRN
  Administered 2023-01-17: 60 mg via INTRAVENOUS

## 2023-01-17 MED ORDER — CHLORHEXIDINE GLUCONATE 0.12 % MT SOLN
15.0000 mL | Freq: Once | OROMUCOSAL | Status: AC
  Administered 2023-01-17: 15 mL via OROMUCOSAL

## 2023-01-17 MED ORDER — HYDROMORPHONE HCL 1 MG/ML IJ SOLN
1.0000 mg | INTRAMUSCULAR | Status: DC | PRN
  Administered 2023-01-17 (×5): 1 mg via INTRAVENOUS
  Filled 2023-01-17 (×5): qty 1

## 2023-01-17 MED ORDER — OXYCODONE HCL 5 MG/5ML PO SOLN: 5.0000 mg | Freq: Once | ORAL | Status: DC | PRN

## 2023-01-17 MED ORDER — VASOPRESSIN 20 UNIT/ML IV SOLN
INTRAVENOUS | Status: AC
  Filled 2023-01-17: qty 1

## 2023-01-17 MED ORDER — DEXAMETHASONE SODIUM PHOSPHATE 10 MG/ML IJ SOLN
INTRAMUSCULAR | Status: AC
  Filled 2023-01-17: qty 1

## 2023-01-17 MED ORDER — VASOPRESSIN 20 UNIT/ML IV SOLN
INTRAVENOUS | Status: DC | PRN
  Administered 2023-01-17: 1 [IU] via INTRAVENOUS

## 2023-01-17 MED ORDER — SENNOSIDES-DOCUSATE SODIUM 8.6-50 MG PO TABS: 1.0000 | ORAL_TABLET | Freq: Every evening | ORAL | Status: DC | PRN

## 2023-01-17 MED ORDER — ONDANSETRON HCL 4 MG/2ML IJ SOLN
INTRAMUSCULAR | Status: DC | PRN
  Administered 2023-01-17: 4 mg via INTRAVENOUS

## 2023-01-17 MED ORDER — SUGAMMADEX SODIUM 200 MG/2ML IV SOLN
INTRAVENOUS | Status: DC | PRN
  Administered 2023-01-17: 200 mg via INTRAVENOUS

## 2023-01-17 MED ORDER — DIPHENHYDRAMINE HCL 12.5 MG/5ML PO ELIX: 12.5000 mg | ORAL_SOLUTION | Freq: Four times a day (QID) | ORAL | Status: DC | PRN

## 2023-01-17 MED ORDER — ORAL CARE MOUTH RINSE: 15.0000 mL | Freq: Once | OROMUCOSAL | Status: AC

## 2023-01-17 MED ORDER — LIDOCAINE 2% (20 MG/ML) 5 ML SYRINGE
INTRAMUSCULAR | Status: DC | PRN
  Administered 2023-01-17: 100 mg via INTRAVENOUS

## 2023-01-17 MED ORDER — ACETAMINOPHEN 500 MG PO TABS
1000.0000 mg | ORAL_TABLET | Freq: Four times a day (QID) | ORAL | Status: DC
  Administered 2023-01-17 – 2023-01-18 (×4): 1000 mg via ORAL
  Filled 2023-01-17 (×4): qty 2

## 2023-01-17 MED ORDER — LACTATED RINGERS IV SOLN
INTRAVENOUS | Status: AC | PRN
  Administered 2023-01-17: 1000 mL via INTRAVENOUS

## 2023-01-17 MED ORDER — FENTANYL CITRATE (PF) 100 MCG/2ML IJ SOLN
INTRAMUSCULAR | Status: AC
  Filled 2023-01-17: qty 2

## 2023-01-17 MED ORDER — SODIUM CHLORIDE 0.9 % IV SOLN: INTRAVENOUS | Status: AC

## 2023-01-17 MED ORDER — HEMOSTATIC AGENTS (NO CHARGE) OPTIME
TOPICAL | Status: DC | PRN
  Administered 2023-01-17: 1 via TOPICAL

## 2023-01-17 MED ORDER — GADOBUTROL 1 MMOL/ML IV SOLN
6.0000 mL | Freq: Once | INTRAVENOUS | Status: AC | PRN
  Administered 2023-01-17: 6 mL via INTRAVENOUS

## 2023-01-17 MED ORDER — PIPERACILLIN-TAZOBACTAM 3.375 G IVPB
3.3750 g | Freq: Three times a day (TID) | INTRAVENOUS | Status: DC
  Administered 2023-01-17 – 2023-01-18 (×4): 3.375 g via INTRAVENOUS
  Filled 2023-01-17 (×3): qty 50

## 2023-01-17 MED ORDER — FENTANYL CITRATE PF 50 MCG/ML IJ SOSY: 25.0000 ug | PREFILLED_SYRINGE | INTRAMUSCULAR | Status: DC | PRN

## 2023-01-17 MED ORDER — FENTANYL CITRATE (PF) 100 MCG/2ML IJ SOLN
INTRAMUSCULAR | Status: DC | PRN
  Administered 2023-01-17: 100 ug via INTRAVENOUS

## 2023-01-17 MED ORDER — PROPOFOL 10 MG/ML IV BOLUS
INTRAVENOUS | Status: DC | PRN
  Administered 2023-01-17: 90 mg via INTRAVENOUS

## 2023-01-17 MED ORDER — ACETAMINOPHEN 650 MG RE SUPP: 650.0000 mg | Freq: Four times a day (QID) | RECTAL | Status: DC | PRN

## 2023-01-17 MED ORDER — DEXAMETHASONE SODIUM PHOSPHATE 10 MG/ML IJ SOLN
INTRAMUSCULAR | Status: DC | PRN
  Administered 2023-01-17: 10 mg via INTRAVENOUS

## 2023-01-17 MED ORDER — ONDANSETRON HCL 4 MG/2ML IJ SOLN: 4.0000 mg | Freq: Four times a day (QID) | INTRAMUSCULAR | Status: DC | PRN

## 2023-01-17 MED ORDER — FENTANYL CITRATE PF 50 MCG/ML IJ SOSY
12.5000 ug | PREFILLED_SYRINGE | Freq: Once | INTRAMUSCULAR | Status: AC
  Administered 2023-01-17: 12.5 ug via INTRAVENOUS
  Filled 2023-01-17: qty 1

## 2023-01-17 MED ORDER — PIPERACILLIN-TAZOBACTAM 3.375 G IVPB
INTRAVENOUS | Status: AC
  Filled 2023-01-17: qty 50

## 2023-01-17 MED ORDER — OXYCODONE HCL 5 MG PO TABS: 5.0000 mg | ORAL_TABLET | Freq: Once | ORAL | Status: DC | PRN

## 2023-01-17 MED ORDER — LACTATED RINGERS IV SOLN: INTRAVENOUS | Status: DC

## 2023-01-17 MED ORDER — ACETAMINOPHEN 10 MG/ML IV SOLN: 1000.0000 mg | Freq: Once | INTRAVENOUS | Status: DC | PRN

## 2023-01-17 MED ORDER — INDOCYANINE GREEN 25 MG IV SOLR
2.5000 mg | Freq: Once | INTRAVENOUS | Status: AC
  Administered 2023-01-17: 2.5 mg via INTRAVENOUS
  Filled 2023-01-17: qty 10

## 2023-01-17 MED ORDER — PROPOFOL 10 MG/ML IV BOLUS
INTRAVENOUS | Status: AC
  Filled 2023-01-17: qty 20

## 2023-01-17 MED ORDER — PHENYLEPHRINE 80 MCG/ML (10ML) SYRINGE FOR IV PUSH (FOR BLOOD PRESSURE SUPPORT)
PREFILLED_SYRINGE | INTRAVENOUS | Status: DC | PRN
  Administered 2023-01-17 (×2): 160 ug via INTRAVENOUS
  Administered 2023-01-17 (×3): 80 ug via INTRAVENOUS

## 2023-01-17 MED ORDER — ENOXAPARIN SODIUM 40 MG/0.4ML IJ SOSY
40.0000 mg | PREFILLED_SYRINGE | INTRAMUSCULAR | Status: DC
  Administered 2023-01-18: 40 mg via SUBCUTANEOUS
  Filled 2023-01-17: qty 0.4

## 2023-01-17 MED ORDER — ONDANSETRON HCL 4 MG/2ML IJ SOLN
INTRAMUSCULAR | Status: AC
  Filled 2023-01-17: qty 2

## 2023-01-17 MED ORDER — ONDANSETRON HCL 4 MG PO TABS: 4.0000 mg | ORAL_TABLET | Freq: Four times a day (QID) | ORAL | Status: DC | PRN

## 2023-01-17 MED ORDER — LACTATED RINGERS IV SOLN
INTRAVENOUS | Status: DC
Start: 2023-01-17 — End: 2023-01-17

## 2023-01-17 MED ORDER — DIPHENHYDRAMINE HCL 50 MG/ML IJ SOLN: 12.5000 mg | Freq: Four times a day (QID) | INTRAMUSCULAR | Status: DC | PRN

## 2023-01-17 MED ORDER — HYDROMORPHONE HCL 1 MG/ML IJ SOLN
0.5000 mg | Freq: Once | INTRAMUSCULAR | Status: AC
  Administered 2023-01-17: 0.5 mg via INTRAVENOUS
  Filled 2023-01-17: qty 1

## 2023-01-17 MED ORDER — OXYCODONE HCL 5 MG PO TABS
5.0000 mg | ORAL_TABLET | ORAL | Status: DC | PRN
  Administered 2023-01-17: 5 mg via ORAL
  Filled 2023-01-17: qty 1

## 2023-01-17 MED ORDER — SODIUM CHLORIDE 0.9 % IV SOLN: INTRAVENOUS | Status: DC

## 2023-01-17 MED ORDER — LIDOCAINE HCL (PF) 2 % IJ SOLN
INTRAMUSCULAR | Status: AC
  Filled 2023-01-17: qty 5

## 2023-01-17 MED ORDER — THYROID 30 MG PO TABS
30.0000 mg | ORAL_TABLET | Freq: Every day | ORAL | Status: DC
  Administered 2023-01-17 – 2023-01-18 (×2): 30 mg via ORAL
  Filled 2023-01-17 (×2): qty 1

## 2023-01-17 MED ORDER — ALBUMIN HUMAN 5 % IV SOLN: INTRAVENOUS | Status: DC | PRN

## 2023-01-17 SURGICAL SUPPLY — 56 items
ADH SKN CLS APL DERMABOND .7 (GAUZE/BANDAGES/DRESSINGS)
APL PRP STRL LF DISP 70% ISPRP (MISCELLANEOUS) ×1
APL SRG 38 LTWT LNG FL B (MISCELLANEOUS)
APPLICATOR ARISTA FLEXITIP XL (MISCELLANEOUS) IMPLANT
APPLIER CLIP 5 13 M/L LIGAMAX5 (MISCELLANEOUS)
APPLIER CLIP ROT 10 11.4 M/L (STAPLE)
APR CLP MED LRG 11.4X10 (STAPLE)
APR CLP MED LRG 5 ANG JAW (MISCELLANEOUS)
BAG COUNTER SPONGE SURGICOUNT (BAG) IMPLANT
BAG SPEC RTRVL 10 TROC 200 (ENDOMECHANICALS) ×1
BAG SPNG CNTER NS LX DISP (BAG)
CABLE HIGH FREQUENCY MONO STRZ (ELECTRODE) ×1 IMPLANT
CHLORAPREP W/TINT 26 (MISCELLANEOUS) ×1 IMPLANT
CLIP APPLIE 5 13 M/L LIGAMAX5 (MISCELLANEOUS) IMPLANT
CLIP APPLIE ROT 10 11.4 M/L (STAPLE) IMPLANT
CLIP LIGATING HEMO O LOK GREEN (MISCELLANEOUS) IMPLANT
COVER MAYO STAND XLG (MISCELLANEOUS) IMPLANT
COVER SURGICAL LIGHT HANDLE (MISCELLANEOUS) ×1 IMPLANT
DERMABOND ADVANCED .7 DNX12 (GAUZE/BANDAGES/DRESSINGS) IMPLANT
DRAPE C-ARM 42X120 X-RAY (DRAPES) IMPLANT
DRSG TEGADERM 2-3/8X2-3/4 SM (GAUZE/BANDAGES/DRESSINGS) ×3 IMPLANT
DRSG TEGADERM 4X4.75 (GAUZE/BANDAGES/DRESSINGS) ×1 IMPLANT
ELECT REM PT RETURN 15FT ADLT (MISCELLANEOUS) ×1 IMPLANT
GAUZE SPONGE 2X2 8PLY STRL LF (GAUZE/BANDAGES/DRESSINGS) ×1 IMPLANT
GLOVE BIO SURGEON STRL SZ7.5 (GLOVE) ×1 IMPLANT
GLOVE INDICATOR 8.0 STRL GRN (GLOVE) ×1 IMPLANT
GOWN STRL REUS W/ TWL XL LVL3 (GOWN DISPOSABLE) ×1 IMPLANT
GOWN STRL REUS W/TWL XL LVL3 (GOWN DISPOSABLE) ×1
GRASPER SUT TROCAR 14GX15 (MISCELLANEOUS) IMPLANT
HEMOSTAT ARISTA ABSORB 3G PWDR (HEMOSTASIS) IMPLANT
HEMOSTAT SNOW SURGICEL 2X4 (HEMOSTASIS) IMPLANT
IRRIG SUCT STRYKERFLOW 2 WTIP (MISCELLANEOUS) ×1
IRRIGATION SUCT STRKRFLW 2 WTP (MISCELLANEOUS) ×1 IMPLANT
KIT BASIN OR (CUSTOM PROCEDURE TRAY) ×1 IMPLANT
KIT IMAGING PINPOINTPAQ (MISCELLANEOUS) IMPLANT
KIT TURNOVER KIT A (KITS) IMPLANT
L-HOOK LAP DISP 36CM (ELECTROSURGICAL)
LHOOK LAP DISP 36CM (ELECTROSURGICAL) IMPLANT
POUCH RETRIEVAL ECOSAC 10 (ENDOMECHANICALS) ×1 IMPLANT
SCISSORS LAP 5X35 DISP (ENDOMECHANICALS) ×1 IMPLANT
SET CHOLANGIOGRAPH MIX (MISCELLANEOUS) IMPLANT
SET TUBE SMOKE EVAC HIGH FLOW (TUBING) ×1 IMPLANT
SLEEVE ADV FIXATION 5X100MM (TROCAR) ×1 IMPLANT
SPIKE FLUID TRANSFER (MISCELLANEOUS) ×1 IMPLANT
STRIP CLOSURE SKIN 1/2X4 (GAUZE/BANDAGES/DRESSINGS) ×1 IMPLANT
SUT MNCRL AB 4-0 PS2 18 (SUTURE) ×1 IMPLANT
SUT VIC AB 0 UR5 27 (SUTURE) IMPLANT
SUT VICRYL 0 TIES 12 18 (SUTURE) IMPLANT
SUT VICRYL 0 UR6 27IN ABS (SUTURE) IMPLANT
TOWEL OR 17X26 10 PK STRL BLUE (TOWEL DISPOSABLE) ×1 IMPLANT
TOWEL OR NON WOVEN STRL DISP B (DISPOSABLE) ×1 IMPLANT
TRAY LAPAROSCOPIC (CUSTOM PROCEDURE TRAY) ×1 IMPLANT
TROCAR ADV FIXATION 12X100MM (TROCAR) IMPLANT
TROCAR ADV FIXATION 5X100MM (TROCAR) ×1 IMPLANT
TROCAR BALLN 12MMX100 BLUNT (TROCAR) IMPLANT
TROCAR XCEL NON-BLD 5MMX100MML (ENDOMECHANICALS) IMPLANT

## 2023-01-17 NOTE — Anesthesia Procedure Notes (Signed)
Procedure Name: Intubation Date/Time: 01/17/2023 3:41 PM  Performed by: Doran Clay, CRNAPre-anesthesia Checklist: Patient identified, Emergency Drugs available, Suction available, Patient being monitored and Timeout performed Patient Re-evaluated:Patient Re-evaluated prior to induction Oxygen Delivery Method: Circle system utilized Preoxygenation: Pre-oxygenation with 100% oxygen Induction Type: IV induction Ventilation: Mask ventilation without difficulty Laryngoscope Size: Mac and 3 Grade View: Grade I Tube type: Oral Tube size: 7.0 mm Number of attempts: 1 Airway Equipment and Method: Stylet Placement Confirmation: ETT inserted through vocal cords under direct vision, positive ETCO2 and breath sounds checked- equal and bilateral Secured at: 22 cm Tube secured with: Tape Dental Injury: Teeth and Oropharynx as per pre-operative assessment
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
## 2023-01-17 NOTE — Plan of Care (Signed)
  Problem: Education: Goal: Knowledge of General Education information will improve Description: Including pain rating scale, medication(s)/side effects and non-pharmacologic comfort measures Outcome: Progressing   Problem: Health Behavior/Discharge Planning: Goal: Ability to manage health-related needs will improve Outcome: Progressing   Problem: Clinical Measurements: Goal: Respiratory complications will improve Outcome: Progressing Goal: Cardiovascular complication will be avoided Outcome: Progressing   Problem: Activity: Goal: Risk for activity intolerance will decrease Outcome: Progressing   Problem: Safety: Goal: Ability to remain free from injury will improve Outcome: Progressing   Problem: Skin Integrity: Goal: Risk for impaired skin integrity will decrease Outcome: Progressing   Problem: Clinical Measurements: Goal: Diagnostic test results will improve Outcome: Not Progressing   Problem: Nutrition: Goal: Adequate nutrition will be maintained Outcome: Not Progressing   Problem: Coping: Goal: Level of anxiety will decrease Outcome: Not Progressing   Problem: Pain Management: Goal: General experience of comfort will improve Outcome: Not Progressing
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
## 2023-01-17 NOTE — Transfer of Care (Signed)
Immediate Anesthesia Transfer of Care Note  Patient: Alexandra Dennis  Procedure(s) Performed: LAPAROSCOPIC CHOLECYSTECTOMY WITH ICG  Patient Location: PACU  Anesthesia Type:General  Level of Consciousness: awake  Airway & Oxygen Therapy: Patient Spontanous Breathing and Patient connected to face mask oxygen  Post-op Assessment: Report given to RN and Post -op Vital signs reviewed and stable  Post vital signs: Reviewed and stable  Last Vitals:  Vitals Value Taken Time  BP 114/63 01/17/23 1715  Temp 37.9 C 01/17/23 1713  Pulse 96 01/17/23 1717  Resp 26 01/17/23 1717  SpO2 92 % 01/17/23 1717  Vitals shown include unfiled device data.  Last Pain:  Vitals:   01/17/23 1713  TempSrc:   PainSc: 0-No pain      Patients Stated Pain Goal: 3 (01/17/23 1436)  Complications: No notable events documented.

## 2023-01-17 NOTE — Telephone Encounter (Signed)
Called to reschedule mri. Pt is currently in the hospital. She will give me a call to schedule once she gets discharged and feeling better. AB

## 2023-01-17 NOTE — Progress Notes (Signed)
Pharmacy Antibiotic Note  Alexandra Dennis is a 72 y.o. female admitted on 01/16/2023 with   Ultrasound shows cholelithiasis without evidence of cholecystitis.  Marland Kitchen  Pharmacy has been consulted for Zosyn dosing.  Active Problem(s): Abdominal pain, nausea, vomiting, frequent bilious emesis. She has not been able to maintain any adequate oral intake.   ID: RUQ abdominal pain with cholelithiasis and dilated CBD:  Afebrile, WBC 12.2, Scr <1  Zosyn 11/11>>  Plan: Zosyn 3.375g IV q8h (4 hour infusion). Pharmacy will sign off. Please reconsult for further dosing assitance.     Height: 5\' 4"  (162.6 cm) Weight: 60.8 kg (134 lb 0.6 oz) IBW/kg (Calculated) : 54.7  Temp (24hrs), Avg:97.6 F (36.4 C), Min:97.5 F (36.4 C), Max:97.8 F (36.6 C)  Recent Labs  Lab 01/16/23 2044 01/17/23 0350  WBC 12.4* 12.2*  CREATININE 0.84 0.75    Estimated Creatinine Clearance: 56.5 mL/min (by C-G formula based on SCr of 0.75 mg/dL).    No Known Allergies  Alexandra Dennis S. Merilynn Finland, PharmD, BCPS Clinical Staff Pharmacist [email or web address]  Pasty Spillers 01/17/2023 8:57 AM

## 2023-01-17 NOTE — Plan of Care (Signed)

## 2023-01-17 NOTE — Anesthesia Preprocedure Evaluation (Signed)
Anesthesia Evaluation  Patient identified by MRN, date of birth, ID band Patient awake    Reviewed: Allergy & Precautions, NPO status , Patient's Chart, lab work & pertinent test results, reviewed documented beta blocker date and time   History of Anesthesia Complications Negative for: history of anesthetic complications  Airway Mallampati: III  TM Distance: >3 FB     Dental no notable dental hx.    Pulmonary neg shortness of breath, asthma , neg COPD, former smoker   breath sounds clear to auscultation       Cardiovascular (-) angina (-) CAD, (-) Past MI, (-) Cardiac Stents, (-) CABG and (-) CHF  Rhythm:Regular Rate:Normal     Neuro/Psych  Headaches, neg Seizures    GI/Hepatic ,GERD  Controlled,,(+) neg Cirrhosis        Endo/Other  Hypothyroidism    Renal/GU Renal disease     Musculoskeletal   Abdominal   Peds  Hematology   Anesthesia Other Findings   Reproductive/Obstetrics                             Anesthesia Physical Anesthesia Plan  ASA: 2  Anesthesia Plan: General   Post-op Pain Management:    Induction: Intravenous  PONV Risk Score and Plan: 2 and Ondansetron and Dexamethasone  Airway Management Planned: Oral ETT  Additional Equipment:   Intra-op Plan:   Post-operative Plan: Extubation in OR  Informed Consent: I have reviewed the patients History and Physical, chart, labs and discussed the procedure including the risks, benefits and alternatives for the proposed anesthesia with the patient or authorized representative who has indicated his/her understanding and acceptance.     Dental advisory given  Plan Discussed with: CRNA  Anesthesia Plan Comments:        Anesthesia Quick Evaluation
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
## 2023-01-17 NOTE — Op Note (Signed)
Alexandra Dennis [id number] 08-13-1950 01/17/2023  Laparoscopic Cholecystectomy with near infrared fluorescent cholangiography procedure Note  Indications: This patient presents with symptomatic gallbladder disease and will undergo laparoscopic cholecystectomy.  Pre-operative Diagnosis: acute calculous cholecystitis  Post-operative Diagnosis: same  Surgeon: Gaynelle Adu MD FACS  Assistants: none  Anesthesia: General endotracheal anesthesia  Procedure Details  The patient was seen again in the Holding Room. The risks, benefits, complications, treatment options, and expected outcomes were discussed with the patient. The possibilities of reaction to medication, pulmonary aspiration, perforation of viscus, bleeding, recurrent infection, finding a normal gallbladder, the need for additional procedures, failure to diagnose a condition, the possible need to convert to an open procedure, and creating a complication requiring transfusion or operation were discussed with the patient. The likelihood of improving the patient's symptoms with return to their baseline status is good.  The patient and/or family concurred with the proposed plan, giving informed consent. The site of surgery properly noted. The patient was taken to Operating Room, identified as Alexandra Dennis and the procedure verified as Laparoscopic Cholecystectomy with ICG dye.  A Time Out was held and the above information confirmed. Antibiotic prophylaxis was administered.  Preoperative MRCP showed no evidence of choledocholithiasis.   ICG dye was administered preoperatively.    General endotracheal anesthesia was then administered and tolerated well. After the induction, the abdomen was prepped with Chloraprep and draped in the sterile fashion. The patient was positioned in the supine position.  Local anesthetic agent was injected into the skin near the umbilicus and an incision made. We dissected down to the abdominal fascia with blunt  dissection.  The fascia was incised vertically and we entered the peritoneal cavity bluntly.  A pursestring suture of 0-Vicryl was placed around the fascial opening.  The Hasson cannula was inserted and secured with the stay suture.  Pneumoperitoneum was then created with CO2 and tolerated well without any adverse changes in the patient's vital signs. An 5-mm port was placed in the subxiphoid position.  Two 5-mm ports were placed in the right upper quadrant. All skin incisions were infiltrated with a local anesthetic agent before making the incision and placing the trocars.   We positioned the patient in reverse Trendelenburg, tilted slightly to the patient's left.  The gallbladder was identified.the gallbladder had some omentum encasing it which had to be peeled away.  This revealed a very distended thick-walled severely inflamed gallbladder with some signs of ischemia in the gallbladder wall.  The fundus was grasped and retracted cephalad. Adhesions were lysed bluntly and with the electrocautery where indicated, taking care not to injure any adjacent organs or viscus. The infundibulum was grasped and retracted laterally, exposing the peritoneum overlying the triangle of Calot. This was then divided and exposed in a blunt fashion. A critical view of the cystic duct and cystic artery was obtained.  The cystic duct was clearly identified and bluntly dissected circumferentially.  Utilizing the Stryker camera system near infrared fluorescent activity was visualized in the liver, common hepatic duct and common bile duct and small bowel.  I did not appreciate any infrared fluorescent activity within the cystic duct.  There were no other structures entering the gallbladder.  this served as a secondary confirmation of our anatomy.  I did not feel it was safe to do an intraoperative cholangiogram given the degree of surrounding inflammation.  The near infrared fluorescent activity seen within the common bile duct and  common hepatic duct was well away from the cystic duct  structure going into the gallbladder.  The cystic duct was then ligated with clips and divided. The cystic artery which had been identified & dissected free was ligated with clips and divided as well.   The gallbladder was dissected from the liver bed in retrograde fashion with the electrocautery. The gallbladder was removed and placed in an Ecco sac.  The gallbladder and Ecco sac were then removed through the umbilical port site.  Portions of the liver bed were just oozy.  The liver bed was irrigated and inspected. Hemostasis was achieved with the electrocautery. Copious irrigation was utilized and was repeatedly aspirated until clear.  There was no signs of bleeding.  I did place a piece of surgical snow in the gallbladder fossa the pursestring suture was used to close the umbilical fascia.  An additional interrupted 0 Vicryl was placed at the umbilical fascia with a PMI suture passer with laparoscopic guidance  We again inspected the right upper quadrant for hemostasis.  The umbilical closure was inspected and there was no air leak and nothing trapped within the closure. Pneumoperitoneum was released as we removed the trocars.  4-0 Monocryl was used to close the skin.  Dermabond was applied. The patient was then extubated and brought to the recovery room in stable condition. Instrument, sponge, and needle counts were correct at closure and at the conclusion of the case.   Findings: Severe acute cholecystitis with Cholelithiasis Positive critical view Near infrared fluorescent cholangiography demonstrated within the liver, common hepatic duct, common bile duct and small bowel Positive surgical snow in the gallbladder fossa  Estimated Blood Loss: Minimal         Drains: none         Specimens: Gallbladder           Complications: None; patient tolerated the procedure well.         Disposition: PACU - hemodynamically stable.          Condition: stable  Mary Sella. Andrey Campanile, MD, FACS General, Bariatric, & Minimally Invasive Surgery [facility],  [facility]

## 2023-01-17 NOTE — Progress Notes (Signed)
PROGRESS NOTE    Alexandra Dennis  ZOX:[id number] DOB: April 06, 1950 DOA: 01/16/2023 PCP: Andi Devon, MD   Brief Narrative:  HPI: Alexandra Dennis is a 72 y.o. female with medical history significant for choroidal melanoma of the left eye, left cavernous carotid aneurysm s/p stenting 2015, hypothyroidism who presented to the ED for evaluation of sudden onset right upper quadrant abdominal pain.   Patient reports developing sudden onset of right upper quadrant abdominal pain around 2 AM Sunday morning.  Pain has been severe and associated with frequent bilious emesis.  She has not been able to maintain any adequate oral intake.  She denies subjective fevers, chills, diaphoresis.  She reports in the past having a flareup of gallbladder pain but previous ultrasounds have been unrevealing except for gallbladder sludge.   ED Course  Labs/Imaging on admission: I have personally reviewed following labs and imaging studies.   Initial vitals showed BP 143/99, pulse 86, RR 16, temp 97.5 F, SpO2 100% on room air.   Labs show WBC 136, potassium 3.9, bicarb 21, BUN 16, creatinine 0.84, serum glucose 146, LFTs within normal limits, lipase 44.  UA negative for UTI.   RUQ ultrasound showed cholelithiasis and gallbladder sludge without evidence of acute cholecystitis.  CBD dilated to 11.7 mm, intrahepatic biliary dilatation also seen.  No visualization of obstructing lesion.   Patient was given IV Zofran and Dilaudid.  Due to persistent abdominal pain with nausea and vomiting and dilated CBD the hospitalist service was consulted to admit for further evaluation and management.    Assessment & Plan:   Principal Problem:   RUQ abdominal pain Active Problems:   Hypothyroidism  Cholelithiasis/possible acute cholecystitis: Presented with abdominal pain.  CT abdomen shows cholelithiasis.  MRCP ruled out choledocholithiasis but raising suspicion for acute cholecystitis with mild CBD dilatation with no  obstruction.  May benefit from cholecystectomy, have consulted general surgery.  Will start on IV Zosyn.  Acquired hypothyroidism: Continue Synthroid.  Mild hyponatremia: Likely hypovolemic.  DVT prophylaxis: SCDs Start: 01/17/23 0040   Code Status: Full Code  Family Communication: Friend present at bedside.  Plan of care discussed with patient in length and he/she verbalized understanding and agreed with it.  Status is: Observation The patient will require care spanning > 2 midnights and should be moved to inpatient because: Will likely need cholecystectomy   Estimated body mass index is 23.01 kg/m as calculated from the following:   Height as of this encounter: 5\' 4"  (1.626 m).   Weight as of this encounter: 60.8 kg.    Nutritional Assessment: Body mass index is 23.01 kg/m.Marland Kitchen Seen by dietician.  I agree with the assessment and plan as outlined below: Nutrition Status:        . Skin Assessment: I have examined the patient's skin and I agree with the wound assessment as performed by the wound care RN as outlined below:    Consultants:  General surgery  Procedures:  As above  Antimicrobials:  Anti-infectives (From admission, onward)    None         Subjective: Patient seen and examined.  Friend at the bedside.  Patient says that there is no change in her pain and has some intermittent nausea.  No other complaint.  Objective: Vitals:   01/17/23 0100 01/17/23 0138 01/17/23 0454 01/17/23 0647  BP: 120/68 126/82 134/76 126/81  Pulse: 79 91 80 85  Resp:  18 16 20   Temp:  97.6 F (36.4 C) 97.8 F (36.6 C)  97.6 F (36.4 C)  TempSrc:  Oral Oral Oral  SpO2: 97% 100% 98% 99%  Weight:  60.8 kg    Height:  5\' 4"  (1.626 m)      Intake/Output Summary (Last 24 hours) at 01/17/2023 0850 Last data filed at 01/17/2023 0648 Gross per 24 hour  Intake 458.32 ml  Output --  Net 458.32 ml   Filed Weights   01/17/23 0138  Weight: 60.8 kg     Examination:  General exam: Appears calm and comfortable  Respiratory system: Clear to auscultation. Respiratory effort normal. Cardiovascular system: S1 & S2 heard, RRR. No JVD, murmurs, rubs, gallops or clicks. No pedal edema. Gastrointestinal system: Abdomen is nondistended, soft and epigastric and right upper quadrant tenderness with positive Murphy sign. No organomegaly or masses felt. Normal bowel sounds heard. Central nervous system: Alert and oriented. No focal neurological deficits. Extremities: Symmetric 5 x 5 power. Skin: No rashes, lesions or ulcers Psychiatry: Judgement and insight appear normal. Mood & affect appropriate.    Data Reviewed: I have personally reviewed following labs and imaging studies  CBC: Recent Labs  Lab 01/16/23 2044 01/17/23 0350  WBC 12.4* 12.2*  HGB 13.4 12.6  HCT 40.5 39.1  MCV 90.8 92.9  PLT 344 309   Basic Metabolic Panel: Recent Labs  Lab 01/16/23 2044 01/17/23 0350  NA 136 133*  K 3.9 3.7  CL 104 104  CO2 21* 22  GLUCOSE 146* 137*  BUN 16 15  CREATININE 0.84 0.75  CALCIUM 9.0 8.3*   GFR: Estimated Creatinine Clearance: 56.5 mL/min (by C-G formula based on SCr of 0.75 mg/dL). Liver Function Tests: Recent Labs  Lab 01/16/23 2044 01/17/23 0350  AST 19 16  ALT 19 16  ALKPHOS 75 65  BILITOT 0.7 0.6  PROT 7.3 6.6  ALBUMIN 4.5 3.8   Recent Labs  Lab 01/16/23 2044  LIPASE 44   No results for input(s): "AMMONIA" in the last 168 hours. Coagulation Profile: No results for input(s): "INR", "PROTIME" in the last 168 hours. Cardiac Enzymes: No results for input(s): "CKTOTAL", "CKMB", "CKMBINDEX", "TROPONINI" in the last 168 hours. BNP (last 3 results) No results for input(s): "PROBNP" in the last 8760 hours. HbA1C: No results for input(s): "HGBA1C" in the last 72 hours. CBG: No results for input(s): "GLUCAP" in the last 168 hours. Lipid Profile: No results for input(s): "CHOL", "HDL", "LDLCALC", "TRIG", "CHOLHDL",  "LDLDIRECT" in the last 72 hours. Thyroid Function Tests: No results for input(s): "TSH", "T4TOTAL", "FREET4", "T3FREE", "THYROIDAB" in the last 72 hours. Anemia Panel: No results for input(s): "VITAMINB12", "FOLATE", "FERRITIN", "TIBC", "IRON", "RETICCTPCT" in the last 72 hours. Sepsis Labs: No results for input(s): "PROCALCITON", "LATICACIDVEN" in the last 168 hours.  No results found for this or any previous visit (from the past 240 hour(s)).   Radiology Studies: MR ABDOMEN MRCP W WO CONTAST  Result Date: 01/17/2023 CLINICAL DATA:  71 year old female with history of right upper quadrant abdominal pain. EXAM: MRI ABDOMEN WITHOUT AND WITH CONTRAST (INCLUDING MRCP) TECHNIQUE: Multiplanar multisequence MR imaging of the abdomen was performed both before and after the administration of intravenous contrast. Heavily T2-weighted images of the biliary and pancreatic ducts were obtained, and three-dimensional MRCP images were rendered by post processing. CONTRAST:  6mL GADAVIST GADOBUTROL 1 MMOL/ML IV SOLN COMPARISON:  No prior abdominal MRI. Right upper quadrant abdominal ultrasound 01/16/2023. FINDINGS: Lower chest: Unremarkable. Hepatobiliary: No suspicious cystic or solid hepatic lesions are noted. In the dependent portion of the gallbladder there is some  amorphous material which is T1 hyperintense and T2 hypointense, likely to represent some biliary sludge and/or tiny gallstones. Gallbladder appears moderately distended with mild gallbladder wall thickening and edema. Trace volume of pericholecystic fluid. MRCP images demonstrate no significant intrahepatic biliary ductal dilatation. However, the common bile duct is dilated measuring up to 9 mm in the porta hepatis. No definite filling defect in the common bile duct to suggest choledocholithiasis. Pancreas: No definite pancreatic mass identified. In the region of the inferior aspect of the pancreatic head there is a hypovascular region, however, on T2  weighted images this appears to have a layering air-fluid level, suggesting that this is in fact a duodenal diverticulum extending into the pancreatic parenchyma, rather than a true mass. No pancreatic ductal dilatation appreciated on MRCP images. No peripancreatic fluid collections or inflammatory changes. Spleen:  Unremarkable. Adrenals/Urinary Tract: There are multiple T1 hypointense and T2 hyperintense lesions in the right kidney, compatible with simple cysts (Bosniak class 1, no imaging follow-up recommended). In addition, in the lower pole of the right kidney (axial image 76 of series 13) there is a 10 mm T1 hyperintense, T2 hypointense lesion which does not enhance, compatible with a mildly proteinaceous/hemorrhagic cyst (Bosniak class 2, no imaging follow-up recommended). No aggressive appearing renal lesions are noted. No hydroureteronephrosis in the visualized portions of the abdomen. Bilateral adrenal glands are normal in appearance. 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 Several colonic diverticuli are noted, particularly in the descending colon. Otherwise, visualized portions are unremarkable. Vascular/Lymphatic: No aneurysm identified in the visualized abdominal vasculature. No lymphadenopathy noted in the abdomen. Other: No significant volume of ascites noted in the visualized portions of the peritoneal cavity. Musculoskeletal: No aggressive appearing osseous lesions are noted in the visualized portions of the skeleton. IMPRESSION: 1. Biliary sludge and/or small gallstones lying dependently in the gallbladder. Gallbladder is moderately distended with mild gallbladder wall thickening and edema and trace volume of pericholecystic fluid. Findings are concerning for potential acute cholecystitis. 2. There is also mild dilatation of the common bile duct (9 mm). However, there is no intrahepatic biliary ductal dilatation.  Additionally, there is no choledocholithiasis or definite cause of obstruction. This degree of common bile duct dilatation could simply reflect the patient's age. Correlation with liver function tests is recommended. 3. 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 Electronically Signed   By: Trudie Reed M.D.   On: 01/17/2023 08:26   US Abdomen Limited RUQ (LIVER/GB)  Result Date: 01/16/2023 CLINICAL DATA:  Right upper quadrant pain. EXAM: ULTRASOUND ABDOMEN LIMITED RIGHT UPPER QUADRANT COMPARISON:  None Available. FINDINGS: Gallbladder: A small amount of echogenic sludge and/or gallstones is seen within the dependent portion of the gallbladder lumen. There is no evidence of gallbladder wall thickening (1.9 mm). A mild amount of pericholecystic fluid is seen. No sonographic Murphy sign noted by sonographer. Common bile duct: Diameter: 11.7 mm Liver: No focal lesion identified. Intrahepatic biliary dilatation is seen. Within normal limits in parenchymal echogenicity. Portal vein is patent on color Doppler imaging with normal direction of blood flow towards the liver. Other: None. IMPRESSION: 1. Cholelithiasis and gallbladder sludge without evidence of acute cholecystitis. 2. Common bile duct and intrahepatic biliary dilatation without visualization of an obstructing lesion. Further evaluation with MRCP is recommended. Electronically Signed   By: Aram Candela M.D.   On: 01/16/2023 23:39    Scheduled  Meds:  thyroid  30 mg Oral QAC breakfast   Continuous Infusions:  lactated ringers 100 mL/hr at 01/17/23 0551     LOS: 0 days   Time spent: 36 minutes Hughie Closs, MD [facility]  01/17/2023, 8:50 AM   *Please note that this is a verbal dictation therefore any spelling or grammatical errors are due to the "[employer]agon [employer] One" system interpretation.  Please  page via Amion and do not message via secure chat for urgent patient care matters. Secure chat can be used for non urgent patient care matters.  How to contact the [facility] Attending or Consulting provider 7A - 7P or covering provider during after hours 7P -7A, for this patient?  Check the care team in [facility] and look for a) attending/consulting [facility] provider listed and b) the [facility] team listed. Page or secure chat 7A-7P. Log into [email or web address] and use [place]'s universal password to access. If you do not have the password, please contact the hospital operator. Locate the [facility] provider you are looking for under [facility] and page to a number that you can be directly reached. If you still have difficulty reaching the provider, please page the [facility] (Director on Call) for the Hospitalists listed on amion for assistance.

## 2023-01-17 NOTE — Hospital Course (Signed)
Alexandra Dennis is a 72 y.o. female with medical history significant for choroidal melanoma of the left eye, left cavernous carotid aneurysm s/p stenting 2015, hypothyroidism who is admitted with RUQ abdominal pain and dilated CBD.

## 2023-01-18 ENCOUNTER — Encounter (HOSPITAL_COMMUNITY): Payer: Self-pay | Admitting: General Surgery

## 2023-01-18 DIAGNOSIS — R1011 Right upper quadrant pain: Secondary | ICD-10-CM | POA: Diagnosis not present

## 2023-01-18 LAB — CBC
HCT: 35.4 % — ABNORMAL LOW (ref 36.0–46.0)
Hemoglobin: 11.5 g/dL — ABNORMAL LOW (ref 12.0–15.0)
MCH: 30.3 pg (ref 26.0–34.0)
MCHC: 32.5 g/dL (ref 30.0–36.0)
MCV: 93.2 fL (ref 80.0–100.0)
Platelets: 259 10*3/uL (ref 150–400)
RBC: 3.8 MIL/uL — ABNORMAL LOW (ref 3.87–5.11)
RDW: 12.7 % (ref 11.5–15.5)
WBC: 13.8 10*3/uL — ABNORMAL HIGH (ref 4.0–10.5)
nRBC: 0 % (ref 0.0–0.2)

## 2023-01-18 LAB — COMPREHENSIVE METABOLIC PANEL
ALT: 44 U/L (ref 0–44)
AST: 57 U/L — ABNORMAL HIGH (ref 15–41)
Albumin: 3.3 g/dL — ABNORMAL LOW (ref 3.5–5.0)
Alkaline Phosphatase: 48 U/L (ref 38–126)
Anion gap: 6 (ref 5–15)
BUN: 11 mg/dL (ref 8–23)
CO2: 23 mmol/L (ref 22–32)
Calcium: 8.1 mg/dL — ABNORMAL LOW (ref 8.9–10.3)
Chloride: 106 mmol/L (ref 98–111)
Creatinine, Ser: 0.59 mg/dL (ref 0.44–1.00)
GFR, Estimated: >60 mL/min (ref >60)
Glucose, Bld: 139 mg/dL — ABNORMAL HIGH (ref 70–99)
Potassium: 3.9 mmol/L (ref 3.5–5.1)
Sodium: 135 mmol/L (ref 135–145)
Total Bilirubin: 0.9 mg/dL (ref <1.2)
Total Protein: 6.3 g/dL — ABNORMAL LOW (ref 6.5–8.1)

## 2023-01-18 MED ORDER — MENTHOL 3 MG MT LOZG
1.0000 | LOZENGE | OROMUCOSAL | Status: DC | PRN
  Administered 2023-01-18: 3 mg via ORAL
  Filled 2023-01-18: qty 9

## 2023-01-18 NOTE — Discharge Instructions (Signed)
LAPAROSCOPIC SURGERY: POST OP INSTRUCTIONS Always review your discharge instruction sheet given to you by the facility where your surgery was performed. IF YOU HAVE DISABILITY OR FAMILY LEAVE FORMS, YOU MUST BRING THEM TO THE OFFICE FOR PROCESSING.   DO NOT GIVE THEM TO YOUR DOCTOR.  PAIN CONTROL  First take acetaminophen (Tylenol) AND/or ibuprofen (Advil) to control your pain after surgery.  Follow directions on package.  Taking acetaminophen (Tylenol) and/or ibuprofen (Advil) regularly after surgery will help to control your pain and lower the amount of prescription pain medication you may need.  You should not take more than 3,000 mg (3 grams) of acetaminophen (Tylenol) in 24 hours.  You should not take ibuprofen (Advil), aleve, motrin, naprosyn or other NSAIDS if you have a history of stomach ulcers or chronic kidney disease.  A prescription for pain medication may be given to you upon discharge.  Take your pain medication as prescribed, if you still have uncontrolled pain after taking acetaminophen (Tylenol) or ibuprofen (Advil). Use ice packs to help control pain. If you need a refill on your pain medication, please contact your pharmacy.  They will contact our office to request authorization. Prescriptions will not be filled after 5pm or on week-ends.  HOME MEDICATIONS Take your usually prescribed medications unless otherwise directed.  DIET You should follow a light diet the first few days after arrival home.  Be sure to include lots of fluids daily. Avoid fatty, fried foods.   CONSTIPATION It is common to experience some constipation after surgery and if you are taking pain medication.  Increasing fluid intake and taking a stool softener (such as Colace) will usually help or prevent this problem from occurring.  A mild laxative (Milk of Magnesia or Miralax) should be taken according to package instructions if there are no bowel movements after 48 hours.  WOUND/INCISION CARE Most  patients will experience some swelling and bruising in the area of the incisions.  Ice packs will help.  Swelling and bruising can take several days to resolve.  Unless discharge instructions indicate otherwise, follow guidelines below  STERI-STRIPS - you may remove your outer bandages 48 hours after surgery, and you may shower at that time.  You have steri-strips (small skin tapes) in place directly over the incision.  These strips should be left on the skin for 7-10 days.   DERMABOND/SKIN GLUE - you may shower in 24 hours.  The glue will flake off over the next 2-3 weeks. Any sutures or staples will be removed at the office during your follow-up visit.  ACTIVITIES You may resume regular (light) daily activities beginning the next day--such as daily self-care, walking, climbing stairs--gradually increasing activities as tolerated.  You may have sexual intercourse when it is comfortable.  Refrain from any heavy lifting or straining until approved by your doctor. You may drive when you are no longer taking prescription pain medication, you can comfortably wear a seatbelt, and you can safely maneuver your car and apply brakes.  FOLLOW-UP You should see your doctor in the office for a follow-up appointment approximately 2-3 weeks after your surgery.  You should have been given your post-op/follow-up appointment when your surgery was scheduled.  If you did not receive a post-op/follow-up appointment, make sure that you call for this appointment within a day or two after you arrive home to insure a convenient appointment time.   WHEN TO CALL YOUR DOCTOR: Fever over 101.0 Inability to urinate Continued bleeding from incision. Increased pain, redness, or drainage   from the incision. Increasing abdominal pain  The clinic staff is available to answer your questions during regular business hours.  Please don't hesitate to call and ask to speak to one of the nurses for clinical concerns.  If you have a  medical emergency, go to the nearest emergency room or call 911.  A surgeon from [facility] is always on call at the hospital. [address], [address], [place], [place]  [address] ? [address] [address], [place], [place]   [address] [phone] ? [phone] ? FAX [phone] Web site: [email or web address]      Managing Your Pain After Surgery Without Opioids    Thank you for participating in our program to help patients manage their pain after surgery without opioids. This is part of our effort to provide you with the best care possible, without exposing you or your family to the risk that opioids pose.  What pain can I expect after surgery? You can expect to have some pain after surgery. This is normal. The pain is typically worse the day after surgery, and quickly begins to get better. Many studies have found that many patients are able to manage their pain after surgery with Over-the-Counter (OTC) medications such as Tylenol and Motrin. If you have a condition that does not allow you to take Tylenol or Motrin, notify your surgical team.  How will I manage my pain? The best strategy for controlling your pain after surgery is around the clock pain control with Tylenol (acetaminophen) and Motrin (ibuprofen or Advil). Alternating these medications with each other allows you to maximize your pain control. In addition to Tylenol and Motrin, you can use heating pads or ice packs on your incisions to help reduce your pain.  How will I alternate your regular strength over-the-counter pain medication? You will take a dose of pain medication every three hours. Start by taking 650 mg of Tylenol (2 pills of 325 mg) 3 hours later take 600 mg of Motrin (3 pills of 200 mg) 3 hours after taking the Motrin take 650 mg of Tylenol 3 hours after that take 600 mg of Motrin.   - 1 -  See example - if your first dose of Tylenol is at 12:00 PM   12:00 PM Tylenol 650 mg (2  pills of 325 mg)  3:00 PM Motrin 600 mg (3 pills of 200 mg)  6:00 PM Tylenol 650 mg (2 pills of 325 mg)  9:00 PM Motrin 600 mg (3 pills of 200 mg)  Continue alternating every 3 hours   We recommend that you follow this schedule around-the-clock for at least 3 days after surgery, or until you feel that it is no longer needed. Use the table on the last page of this handout to keep track of the medications you are taking. Important: Do not take more than 3000mg of Tylenol or 3200mg of Motrin in a 24-hour period. Do not take ibuprofen/Motrin if you have a history of bleeding stomach ulcers, severe kidney disease, &/or actively taking a blood thinner  What if I still have pain? If you have pain that is not controlled with the over-the-counter pain medications (Tylenol and Motrin or Advil) you might have what we call "breakthrough" pain. You will receive a prescription for a small amount of an opioid pain medication such as Oxycodone, Tramadol, or Tylenol with Codeine. Use these opioid pills in the first 24 hours after surgery if you have breakthrough pain. Do not take more than 1   pill every 4-6 hours.  If you still have uncontrolled pain after using all opioid pills, don't hesitate to call our staff using the number provided. We will help make sure you are managing your pain in the best way possible, and if necessary, we can provide a prescription for additional pain medication.   Day 1    Time  Name of Medication Number of pills taken  Amount of Acetaminophen  Pain Level   Comments  AM PM       AM PM       AM PM       AM PM       AM PM       AM PM       AM PM       AM PM       Total Daily amount of Acetaminophen Do not take more than  3,000 mg per day      Day 2    Time  Name of Medication Number of pills taken  Amount of Acetaminophen  Pain Level   Comments  AM PM       AM PM       AM PM       AM PM       AM PM       AM PM       AM PM       AM PM       Total Daily  amount of Acetaminophen Do not take more than  3,000 mg per day      Day 3    Time  Name of Medication Number of pills taken  Amount of Acetaminophen  Pain Level   Comments  AM PM       AM PM       AM PM       AM PM         AM PM       AM PM       AM PM       AM PM       Total Daily amount of Acetaminophen Do not take more than  3,000 mg per day      Day 4    Time  Name of Medication Number of pills taken  Amount of Acetaminophen  Pain Level   Comments  AM PM       AM PM       AM PM       AM PM       AM PM       AM PM       AM PM       AM PM       Total Daily amount of Acetaminophen Do not take more than  3,000 mg per day      Day 5    Time  Name of Medication Number of pills taken  Amount of Acetaminophen  Pain Level   Comments  AM PM       AM PM       AM PM       AM PM       AM PM       AM PM       AM PM       AM PM       Total Daily amount of Acetaminophen Do not take more than  3,000 mg per day        Day 6    Time  Name of Medication Number of pills taken  Amount of Acetaminophen  Pain Level  Comments  AM PM       AM PM       AM PM       AM PM       AM PM       AM PM       AM PM       AM PM       Total Daily amount of Acetaminophen Do not take more than  3,000 mg per day      Day 7    Time  Name of Medication Number of pills taken  Amount of Acetaminophen  Pain Level   Comments  AM PM       AM PM       AM PM       AM PM       AM PM       AM PM       AM PM       AM PM       Total Daily amount of Acetaminophen Do not take more than  3,000 mg per day        For additional information about how and where to safely dispose of unused opioid medications - https://www.morepowerfulnc.org  Disclaimer: This document contains information and/or instructional materials adapted from [facility] for the typical patient with your condition. It does not replace medical advice from your health care provider because  your experience may differ from that of the typical patient. Talk to your health care provider if you have any questions about this document, your condition or your treatment plan. Adapted from [facility]  

## 2023-01-18 NOTE — Progress Notes (Signed)
Progress Note  1 Day Post-Op  Subjective: Mild abdominal soreness as expected. Denies nausea, passing some flatus. Has been ambulating in hallway already this AM. Partner at bedside.   Objective: Vital signs in last 24 hours: Temp:  [98.1 F (36.7 C)-100.3 F (37.9 C)] 98.1 F (36.7 C) (11/12 0749) Pulse Rate:  [73-92] 75 (11/12 0749) Resp:  [16-18] 18 (11/12 0749) BP: (109-117)/(63-81) 109/81 (11/12 0749) SpO2:  [94 %-99 %] 99 % (11/12 0749) Last BM Date : 01/16/23  Intake/Output from previous day: 11/11 0701 - 11/12 0700 In: 1798.2 [I.V.:1125.4; IV Piggyback:672.9] Out: 20 [Blood:20] Intake/Output this shift: No intake/output data recorded.  PE: General: pleasant, WD, WN female, NAD HEENT: sclera anicteric Lungs: Respiratory effort nonlabored Abd: soft, appropriately ttp, incisions CDI, ND Psych: A&Ox3 with an appropriate affect.    Lab Results:  Recent Labs    01/17/23 0350 01/18/23 0422  WBC 12.2* 13.8*  HGB 12.6 11.5*  HCT 39.1 35.4*  PLT 309 259   BMET Recent Labs    01/17/23 0350 01/18/23 0422  NA 133* 135  K 3.7 3.9  CL 104 106  CO2 22 23  GLUCOSE 137* 139*  BUN 15 11  CREATININE 0.75 0.59  CALCIUM 8.3* 8.1*   PT/INR No results for input(s): "LABPROT", "INR" in the last 72 hours. CMP     Component Value Date/Time   NA 135 01/18/2023 0422   K 3.9 01/18/2023 0422   CL 106 01/18/2023 0422   CO2 23 01/18/2023 0422   GLUCOSE 139 (H) 01/18/2023 0422   BUN 11 01/18/2023 0422   CREATININE 0.59 01/18/2023 0422   CALCIUM 8.1 (L) 01/18/2023 0422   PROT 6.3 (L) 01/18/2023 0422   ALBUMIN 3.3 (L) 01/18/2023 0422   AST 57 (H) 01/18/2023 0422   ALT 44 01/18/2023 0422   ALKPHOS 48 01/18/2023 0422   BILITOT 0.9 01/18/2023 0422   GFRNONAA >60 01/18/2023 0422   GFRAA >60 02/17/2017 1203   Lipase     Component Value Date/Time   LIPASE 44 01/16/2023 2044       Studies/Results: MR 3D Recon At Scanner  Result Date: 01/18/2023 CLINICAL  DATA:  72 year old female with history of right upper quadrant abdominal pain. EXAM: MRI ABDOMEN WITHOUT AND WITH CONTRAST (INCLUDING MRCP) TECHNIQUE: Multiplanar multisequence MR imaging of the abdomen was performed both before and after the administration of intravenous contrast. Heavily T2-weighted images of the biliary and pancreatic ducts were obtained, and three-dimensional MRCP images were rendered by post processing. CONTRAST:  6mL GADAVIST GADOBUTROL 1 MMOL/ML IV SOLN COMPARISON:  No prior abdominal MRI. Right upper quadrant abdominal ultrasound 01/16/2023. FINDINGS: Lower chest: Unremarkable. Hepatobiliary: No suspicious cystic or solid hepatic lesions are noted. In the dependent portion of the gallbladder there is some amorphous material which is T1 hyperintense and T2 hypointense, likely to represent some biliary sludge and/or tiny gallstones. Gallbladder appears moderately distended with mild gallbladder wall thickening and edema. Trace volume of pericholecystic fluid. MRCP images demonstrate no significant intrahepatic biliary ductal dilatation. However, the common bile duct is dilated measuring up to 9 mm in the porta hepatis. No definite filling defect in the common bile duct to suggest choledocholithiasis. Pancreas: No definite pancreatic mass identified. In the region of the inferior aspect of the pancreatic head there is a hypovascular region, however, on T2 weighted images this appears to have a layering air-fluid level, suggesting that this is in fact a duodenal diverticulum extending into the pancreatic parenchyma, rather than a true  mass. No pancreatic ductal dilatation appreciated on MRCP images. No peripancreatic fluid collections or inflammatory changes. Spleen:  Unremarkable. Adrenals/Urinary Tract: There are multiple T1 hypointense and T2 hyperintense lesions in the right kidney, compatible with simple cysts (Bosniak class 1, no imaging follow-up recommended). In addition, in the lower  pole of the right kidney (axial image 76 of series 13) there is a 10 mm T1 hyperintense, T2 hypointense lesion which does not enhance, compatible with a mildly proteinaceous/hemorrhagic cyst (Bosniak class 2, no imaging follow-up recommended). No aggressive appearing renal lesions are noted. No hydroureteronephrosis in the visualized portions of the abdomen. Bilateral adrenal glands are normal in appearance. 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 Several colonic diverticuli are noted, particularly in the descending colon. Otherwise, visualized portions are unremarkable. Vascular/Lymphatic: No aneurysm identified in the visualized abdominal vasculature. No lymphadenopathy noted in the abdomen. Other: No significant volume of ascites noted in the visualized portions of the peritoneal cavity. Musculoskeletal: No aggressive appearing osseous lesions are noted in the visualized portions of the skeleton. IMPRESSION: 1. Biliary sludge and/or small gallstones lying dependently in the gallbladder. Gallbladder is moderately distended with mild gallbladder wall thickening and edema and trace volume of pericholecystic fluid. Findings are concerning for potential acute cholecystitis. 2. There is also mild dilatation of the common bile duct (9 mm). However, there is no intrahepatic biliary ductal dilatation. Additionally, there is no choledocholithiasis or definite cause of obstruction. This degree of common bile duct dilatation could simply reflect the patient's age. Correlation with liver function tests is recommended. 3. 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 Electronically Signed   By: Trudie Reed M.D.   On: 01/18/2023 07:49   DG C-Arm 1-60 Min-No Report  Result Date: 01/17/2023 Fluoroscopy was utilized  by the requesting physician.  No radiographic interpretation.   MR ABDOMEN MRCP W WO CONTAST  Result Date: 01/17/2023 CLINICAL DATA:  72 year old female with history of right upper quadrant abdominal pain. EXAM: MRI ABDOMEN WITHOUT AND WITH CONTRAST (INCLUDING MRCP) TECHNIQUE: Multiplanar multisequence MR imaging of the abdomen was performed both before and after the administration of intravenous contrast. Heavily T2-weighted images of the biliary and pancreatic ducts were obtained, and three-dimensional MRCP images were rendered by post processing. CONTRAST:  6mL GADAVIST GADOBUTROL 1 MMOL/ML IV SOLN COMPARISON:  No prior abdominal MRI. Right upper quadrant abdominal ultrasound 01/16/2023. FINDINGS: Lower chest: Unremarkable. Hepatobiliary: No suspicious cystic or solid hepatic lesions are noted. In the dependent portion of the gallbladder there is some amorphous material which is T1 hyperintense and T2 hypointense, likely to represent some biliary sludge and/or tiny gallstones. Gallbladder appears moderately distended with mild gallbladder wall thickening and edema. Trace volume of pericholecystic fluid. MRCP images demonstrate no significant intrahepatic biliary ductal dilatation. However, the common bile duct is dilated measuring up to 9 mm in the porta hepatis. No definite filling defect in the common bile duct to suggest choledocholithiasis. Pancreas: No definite pancreatic mass identified. In the region of the inferior aspect of the pancreatic head there is a hypovascular region, however, on T2 weighted images this appears to have a layering air-fluid level, suggesting that this is in fact a duodenal diverticulum extending into the pancreatic parenchyma, rather than a true mass. No pancreatic ductal dilatation appreciated on MRCP images. No peripancreatic fluid collections or inflammatory changes. Spleen:  Unremarkable. Adrenals/Urinary Tract: There are multiple  T1 hypointense and T2 hyperintense lesions  in the right kidney, compatible with simple cysts (Bosniak class 1, no imaging follow-up recommended). In addition, in the lower pole of the right kidney (axial image 76 of series 13) there is a 10 mm T1 hyperintense, T2 hypointense lesion which does not enhance, compatible with a mildly proteinaceous/hemorrhagic cyst (Bosniak class 2, no imaging follow-up recommended). No aggressive appearing renal lesions are noted. No hydroureteronephrosis in the visualized portions of the abdomen. Bilateral adrenal glands are normal in appearance. 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 Several colonic diverticuli are noted, particularly in the descending colon. Otherwise, visualized portions are unremarkable. Vascular/Lymphatic: No aneurysm identified in the visualized abdominal vasculature. No lymphadenopathy noted in the abdomen. Other: No significant volume of ascites noted in the visualized portions of the peritoneal cavity. Musculoskeletal: No aggressive appearing osseous lesions are noted in the visualized portions of the skeleton. IMPRESSION: 1. Biliary sludge and/or small gallstones lying dependently in the gallbladder. Gallbladder is moderately distended with mild gallbladder wall thickening and edema and trace volume of pericholecystic fluid. Findings are concerning for potential acute cholecystitis. 2. There is also mild dilatation of the common bile duct (9 mm). However, there is no intrahepatic biliary ductal dilatation. Additionally, there is no choledocholithiasis or definite cause of obstruction. This degree of common bile duct dilatation could simply reflect the patient's age. Correlation with liver function tests is recommended. 3. 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 Electronically Signed   By:  Trudie Reed M.D.   On: 01/17/2023 08:26   US Abdomen Limited RUQ (LIVER/GB)  Result Date: 01/16/2023 CLINICAL DATA:  Right upper quadrant pain. EXAM: ULTRASOUND ABDOMEN LIMITED RIGHT UPPER QUADRANT COMPARISON:  None Available. FINDINGS: Gallbladder: A small amount of echogenic sludge and/or gallstones is seen within the dependent portion of the gallbladder lumen. There is no evidence of gallbladder wall thickening (1.9 mm). A mild amount of pericholecystic fluid is seen. No sonographic Murphy sign noted by sonographer. Common bile duct: Diameter: 11.7 mm Liver: No focal lesion identified. Intrahepatic biliary dilatation is seen. Within normal limits in parenchymal echogenicity. Portal vein is patent on color Doppler imaging with normal direction of blood flow towards the liver. Other: None. IMPRESSION: 1. Cholelithiasis and gallbladder sludge without evidence of acute cholecystitis. 2. Common bile duct and intrahepatic biliary dilatation without visualization of an obstructing lesion. Further evaluation with MRCP is recommended. Electronically Signed   By: Aram Candela M.D.   On: 01/16/2023 23:39    Anti-infectives: Anti-infectives (From admission, onward)    Start     Dose/Rate Route Frequency Ordered Stop   01/17/23 1529  piperacillin-tazobactam (ZOSYN) 3.375 GM/50ML IVPB       Note to Pharmacy: Carmelia Roller R: cabinet override      01/17/23 1529 01/17/23 1702   01/17/23 1000  piperacillin-tazobactam (ZOSYN) IVPB 3.375 g        3.375 g 12.5 mL/hr over 240 Minutes Intravenous Every 8 hours 01/17/23 0853          Assessment/Plan  Acute cholecystitis  POD1 s/p laparoscopic cholecystectomy with ICG dye Dr. Andrey Campanile - stable for DC from surgery perspective, reviewed post-op instruction with patient and family  - labs this AM as expected - pt declined Rx for oxycodone - follow up in 3-4 weeks   FEN: D3 diet VTE: LMWH ID: [email or web address] - no further  abx needed  LOS: 1 day   .     Juliet Rude, [facility] 01/18/2023, 10:57 AM Please see Amion for pager number during day hours 7:00am-4:30pm

## 2023-01-18 NOTE — TOC CM/SW Note (Addendum)
Transition of Care [facility]) - Inpatient Brief Assessment   Patient Details  Name: Alexandra Dennis MRN: [id number] Date of Birth: 11/10/1950  [facility] CM/SW Contact:    Darleene Cleaver, LCSW Phone Number: 01/18/2023, 11:31 AM   Clinical Narrative:  Brief Assessment completed, patient has a PCP, insurance, and does not have any SDOH needs.  Transition of Care Asessment: Insurance and Status: Insurance coverage has been reviewed Patient has primary care physician: Yes Home environment has been reviewed: Yes Prior level of function:: Indep Prior/Current Home Services: No current home services Social Determinants of Health Reivew: SDOH reviewed no interventions necessary Readmission risk has been reviewed: Yes Transition of care needs: no transition of care needs at this time

## 2023-01-18 NOTE — Plan of Care (Addendum)
  Problem: Education: Goal: Knowledge of General Education information will improve Description: Including pain rating scale, medication(s)/side effects and non-pharmacologic comfort measures Outcome: Progressing   Problem: Health Behavior/Discharge Planning: Goal: Ability to manage health-related needs will improve Outcome: Progressing   Problem: Clinical Measurements: Goal: Will remain free from infection Outcome: Progressing Goal: Diagnostic test results will improve Outcome: Progressing   Problem: Activity: Goal: Risk for activity intolerance will decrease Outcome: Progressing   Problem: Nutrition: Goal: Adequate nutrition will be maintained Outcome: Progressing   Problem: Safety: Goal: Ability to remain free from injury will improve Outcome: Progressing   Problem: Skin Integrity: Goal: Risk for impaired skin integrity will decrease Outcome: Progressing    AVS was printed, reviewed with and given to the patient. The patient verbalized understanding its content. The patient left the unit at 1437 in a stable condition.

## 2023-01-18 NOTE — Discharge Summary (Signed)
Physician Discharge Summary  Alexandra Dennis UXL:[id number] DOB: 08-19-1950 DOA: 01/16/2023  PCP: Andi Devon, MD  Admit date: 01/16/2023 Discharge date: 01/18/2023 30 Day Unplanned Readmission Risk Score    Flowsheet Row ED to Hosp-Admission (Current) from 01/16/2023 in [facility]  30 Day Unplanned Readmission Risk Score (%) 9.2 Filed at 01/18/2023 0801       This score is the patient's risk of an unplanned readmission within 30 days of being discharged (0 -100%). The score is based on dignosis, age, lab data, medications, orders, and past utilization.   Low:  0-14.9   Medium: 15-21.9   High: 22-29.9   Extreme: 30 and above          Admitted From: Home Disposition: Home  Recommendations for Outpatient Follow-up:  Follow up with PCP in 1-2 weeks Please obtain BMP/CBC in one week Follow-up with general surgery in 2 weeks Please follow up with your PCP on the following pending results: Unresulted Labs (From admission, onward)     Start     Ordered   01/24/23 0500  Creatinine, serum  (enoxaparin (LOVENOX)  CrCl >/= 30 mL/min  )  Weekly,   R     Comments: while on enoxaparin therapy.   Question:  Specimen collection method  Answer:  Lab=Lab collect   01/17/23 1759              Home Health: None Equipment/Devices: None  Discharge Condition: Stable CODE STATUS: Full code Diet recommendation: Cardiac  Subjective: Seen and examined.  Doing well.  Expected postop abdominal pain but no nausea and tolerating diet.  Prefers to go home.  Brief/Interim Summary: Alexandra Dennis is a 72 y.o. female with medical history significant for choroidal melanoma of the left eye, left cavernous carotid aneurysm s/p stenting 2015, hypothyroidism who presented to the ED for evaluation of sudden onset right upper quadrant abdominal pain. She denied subjective fevers, chills, diaphoresis.  She reports in the past having a flareup of  gallbladder pain but previous ultrasounds have been unrevealing except for gallbladder sludge. RUQ ultrasound showed cholelithiasis and gallbladder sludge without evidence of acute cholecystitis.  CBD dilated to 11.7 mm, intrahepatic biliary dilatation also seen.  No visualization of obstructing lesion.Patient was given IV Zofran and Dilaudid.  Due to persistent abdominal pain with nausea and vomiting and dilated CBD the hospitalist service was consulted to admit for further evaluation and management.  MRCP ruled out choledocholithiasis but raised suspicion for possible acute cholecystitis with mild CBD dilatation but no obstruction.  Clinically, she did have Murphy sign positive and likely acute cholecystitis for which general surgery was consulted and patient ended up having laparoscopic cholecystectomy on 01/17/2023 by Dr. Gaynelle Adu and this was uncomplicated.  Started on the diet postoperatively, doing well and expected.  Tolerating diet.  Seen by general surgery, they cleared her for discharge.  No antibiotics indicated.  She declined opioid pain medications.  She is going home in stable condition.   Acquired hypothyroidism: Continue Synthroid.   Mild hyponatremia: Likely hypovolemic which resolved..  Leukocytosis: Secondary to acute cholecystitis, slight rise today due to reactive/surgery, hopefully this will improve.  She is afebrile.  Pete CBC in 1 week at PCPs office.  Discharge plan was discussed with patient and/or family member and they verbalized understanding and agreed with it.  Discharge Diagnoses:  Principal Problem:   RUQ abdominal pain Active Problems:   Hypothyroidism   Acute cholecystitis    Discharge Instructions  Allergies as of 01/18/2023   No Known Allergies      Medication List     TAKE these medications    aspirin 81 MG tablet Take 81 mg by mouth daily.   DIGEST II PO Take 1 tablet by mouth daily as needed (for reflux).   Magnesium Oxide -Mg  Supplement 500 MG Tabs Take 500 mg by mouth at bedtime.   thyroid 30 MG tablet Commonly known as: ARMOUR Take 30 mg by mouth daily before breakfast. Take 5x a week   valACYclovir 500 MG tablet Commonly known as: VALTREX Take 1 tablet (500 mg total) by mouth daily.        Follow-up Information     Gaynelle Adu, MD. Call.   Specialty: General Surgery Why: We are working on an appointment in 3-4 weeks, please call to confirm date/time if you do not hear from the office in the next 2-3 days Contact information: [address] [place] [address] [phone]         Andi Devon, MD Follow up in 1 week(s).   Specialty: Internal Medicine Contact information: [address] [place] [address] [phone]                No Known Allergies  Consultations: General surgery   Procedures/Studies: MR 3D Recon At Scanner  Result Date: 01/18/2023 CLINICAL DATA:  72 year old female with history of right upper quadrant abdominal pain. EXAM: MRI ABDOMEN WITHOUT AND WITH CONTRAST (INCLUDING MRCP) TECHNIQUE: Multiplanar multisequence MR imaging of the abdomen was performed both before and after the administration of intravenous contrast. Heavily T2-weighted images of the biliary and pancreatic ducts were obtained, and three-dimensional MRCP images were rendered by post processing. CONTRAST:  6mL GADAVIST GADOBUTROL 1 MMOL/ML IV SOLN COMPARISON:  No prior abdominal MRI. Right upper quadrant abdominal ultrasound 01/16/2023. FINDINGS: Lower chest: Unremarkable. Hepatobiliary: No suspicious cystic or solid hepatic lesions are noted. In the dependent portion of the gallbladder there is some amorphous material which is T1 hyperintense and T2 hypointense, likely to represent some biliary sludge and/or tiny gallstones. Gallbladder appears moderately distended with mild gallbladder wall thickening and edema. Trace volume of pericholecystic fluid. MRCP  images demonstrate no significant intrahepatic biliary ductal dilatation. However, the common bile duct is dilated measuring up to 9 mm in the porta hepatis. No definite filling defect in the common bile duct to suggest choledocholithiasis. Pancreas: No definite pancreatic mass identified. In the region of the inferior aspect of the pancreatic head there is a hypovascular region, however, on T2 weighted images this appears to have a layering air-fluid level, suggesting that this is in fact a duodenal diverticulum extending into the pancreatic parenchyma, rather than a true mass. No pancreatic ductal dilatation appreciated on MRCP images. No peripancreatic fluid collections or inflammatory changes. Spleen:  Unremarkable. Adrenals/Urinary Tract: There are multiple T1 hypointense and T2 hyperintense lesions in the right kidney, compatible with simple cysts (Bosniak class 1, no imaging follow-up recommended). In addition, in the lower pole of the right kidney (axial image 76 of series 13) there is a 10 mm T1 hyperintense, T2 hypointense lesion which does not enhance, compatible with a mildly proteinaceous/hemorrhagic cyst (Bosniak class 2, no imaging follow-up recommended). No aggressive appearing renal lesions are noted. No hydroureteronephrosis in the visualized portions of the abdomen. Bilateral adrenal glands are normal in appearance. 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 Several colonic diverticuli are noted,  particularly in the descending colon. Otherwise, visualized portions are unremarkable. Vascular/Lymphatic: No aneurysm identified in the visualized abdominal vasculature. No lymphadenopathy noted in the abdomen. Other: No significant volume of ascites noted in the visualized portions of the peritoneal cavity. Musculoskeletal: No aggressive appearing osseous lesions are noted in the visualized portions of the skeleton.  IMPRESSION: 1. Biliary sludge and/or small gallstones lying dependently in the gallbladder. Gallbladder is moderately distended with mild gallbladder wall thickening and edema and trace volume of pericholecystic fluid. Findings are concerning for potential acute cholecystitis. 2. There is also mild dilatation of the common bile duct (9 mm). However, there is no intrahepatic biliary ductal dilatation. Additionally, there is no choledocholithiasis or definite cause of obstruction. This degree of common bile duct dilatation could simply reflect the patient's age. Correlation with liver function tests is recommended. 3. 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 Electronically Signed   By: Trudie Reed M.D.   On: 01/18/2023 07:49   DG C-Arm 1-60 Min-No Report  Result Date: 01/17/2023 Fluoroscopy was utilized by the requesting physician.  No radiographic interpretation.   MR ABDOMEN MRCP W WO CONTAST  Result Date: 01/17/2023 CLINICAL DATA:  72 year old female with history of right upper quadrant abdominal pain. EXAM: MRI ABDOMEN WITHOUT AND WITH CONTRAST (INCLUDING MRCP) TECHNIQUE: Multiplanar multisequence MR imaging of the abdomen was performed both before and after the administration of intravenous contrast. Heavily T2-weighted images of the biliary and pancreatic ducts were obtained, and three-dimensional MRCP images were rendered by post processing. CONTRAST:  6mL GADAVIST GADOBUTROL 1 MMOL/ML IV SOLN COMPARISON:  No prior abdominal MRI. Right upper quadrant abdominal ultrasound 01/16/2023. FINDINGS: Lower chest: Unremarkable. Hepatobiliary: No suspicious cystic or solid hepatic lesions are noted. In the dependent portion of the gallbladder there is some amorphous material which is T1 hyperintense and T2 hypointense, likely to represent some biliary sludge and/or tiny  gallstones. Gallbladder appears moderately distended with mild gallbladder wall thickening and edema. Trace volume of pericholecystic fluid. MRCP images demonstrate no significant intrahepatic biliary ductal dilatation. However, the common bile duct is dilated measuring up to 9 mm in the porta hepatis. No definite filling defect in the common bile duct to suggest choledocholithiasis. Pancreas: No definite pancreatic mass identified. In the region of the inferior aspect of the pancreatic head there is a hypovascular region, however, on T2 weighted images this appears to have a layering air-fluid level, suggesting that this is in fact a duodenal diverticulum extending into the pancreatic parenchyma, rather than a true mass. No pancreatic ductal dilatation appreciated on MRCP images. No peripancreatic fluid collections or inflammatory changes. Spleen:  Unremarkable. Adrenals/Urinary Tract: There are multiple T1 hypointense and T2 hyperintense lesions in the right kidney, compatible with simple cysts (Bosniak class 1, no imaging follow-up recommended). In addition, in the lower pole of the right kidney (axial image 76 of series 13) there is a 10 mm T1 hyperintense, T2 hypointense lesion which does not enhance, compatible with a mildly proteinaceous/hemorrhagic cyst (Bosniak class 2, no imaging follow-up recommended). No aggressive appearing renal lesions are noted. No hydroureteronephrosis in the visualized portions of the abdomen. Bilateral adrenal glands are normal in appearance. 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 Several colonic diverticuli are noted, particularly in the descending colon. Otherwise, visualized portions are unremarkable. Vascular/Lymphatic: No aneurysm identified in the visualized abdominal vasculature. No lymphadenopathy noted in the  abdomen. Other: No significant volume of ascites noted in the visualized  portions of the peritoneal cavity. Musculoskeletal: No aggressive appearing osseous lesions are noted in the visualized portions of the skeleton. IMPRESSION: 1. Biliary sludge and/or small gallstones lying dependently in the gallbladder. Gallbladder is moderately distended with mild gallbladder wall thickening and edema and trace volume of pericholecystic fluid. Findings are concerning for potential acute cholecystitis. 2. There is also mild dilatation of the common bile duct (9 mm). However, there is no intrahepatic biliary ductal dilatation. Additionally, there is no choledocholithiasis or definite cause of obstruction. This degree of common bile duct dilatation could simply reflect the patient's age. Correlation with liver function tests is recommended. 3. 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 Electronically Signed   By: Trudie Reed M.D.   On: 01/17/2023 08:26   US Abdomen Limited RUQ (LIVER/GB)  Result Date: 01/16/2023 CLINICAL DATA:  Right upper quadrant pain. EXAM: ULTRASOUND ABDOMEN LIMITED RIGHT UPPER QUADRANT COMPARISON:  None Available. FINDINGS: Gallbladder: A small amount of echogenic sludge and/or gallstones is seen within the dependent portion of the gallbladder lumen. There is no evidence of gallbladder wall thickening (1.9 mm). A mild amount of pericholecystic fluid is seen. No sonographic Murphy sign noted by sonographer. Common bile duct: Diameter: 11.7 mm Liver: No focal lesion identified. Intrahepatic biliary dilatation is seen. Within normal limits in parenchymal echogenicity. Portal vein is patent on color Doppler imaging with normal direction of blood flow towards the liver. Other: None. IMPRESSION: 1. Cholelithiasis and gallbladder sludge without evidence of acute cholecystitis. 2. Common bile duct and intrahepatic biliary dilatation without  visualization of an obstructing lesion. Further evaluation with MRCP is recommended. Electronically Signed   By: Aram Candela M.D.   On: 01/16/2023 23:39     Discharge Exam: Vitals:   01/17/23 1922 01/18/23 0749  BP: 115/66 109/81  Pulse: 73 75  Resp: 16 18  Temp: 98.5 F (36.9 C) 98.1 F (36.7 C)  SpO2: 95% 99%   Vitals:   01/17/23 1745 01/17/23 1808 01/17/23 1922 01/18/23 0749  BP: 111/63 111/65 115/66 109/81  Pulse: 84 83 73 75  Resp: 17  16 18   Temp: 100 F (37.8 C) 98.1 F (36.7 C) 98.5 F (36.9 C) 98.1 F (36.7 C)  TempSrc:  Oral  Oral  SpO2: 96% 94% 95% 99%  Weight:      Height:        General: Pt is alert, awake, not in acute distress Cardiovascular: RRR, S1/S2 +, no rubs, no gallops Respiratory: CTA bilaterally, no wheezing, no rhonchi Abdominal: Soft, mild tenderness at laparoscopic site, ND, bowel sounds + Extremities: no edema, no cyanosis    The results of significant diagnostics from this hospitalization (including imaging, microbiology, ancillary and laboratory) are listed below for reference.     Microbiology: No results found for this or any previous visit (from the past 240 hour(s)).   Labs: BNP (last 3 results) No results for input(s): "BNP" in the last 8760 hours. Basic Metabolic Panel: Recent Labs  Lab 01/16/23 2044 01/17/23 0350 01/18/23 0422  NA 136 133* 135  K 3.9 3.7 3.9  CL 104 104 106  CO2 21* 22 23  GLUCOSE 146* 137* 139*  BUN 16 15 11   CREATININE 0.84 0.75 0.59  CALCIUM 9.0 8.3* 8.1*   Liver Function Tests: Recent Labs  Lab 01/16/23 2044 01/17/23 0350 01/18/23 0422  AST 19  16 57*  ALT 19 16 44  ALKPHOS 75 65 48  BILITOT 0.7 0.6 0.9  PROT 7.3 6.6 6.3*  ALBUMIN 4.5 3.8 3.3*   Recent Labs  Lab 01/16/23 2044  LIPASE 44   No results for input(s): "AMMONIA" in the last 168 hours. CBC: Recent Labs  Lab 01/16/23 2044 01/17/23 0350 01/18/23 0422  WBC 12.4* 12.2* 13.8*  HGB 13.4 12.6 11.5*  HCT 40.5 39.1  35.4*  MCV 90.8 92.9 93.2  PLT 344 309 259   Cardiac Enzymes: No results for input(s): "CKTOTAL", "CKMB", "CKMBINDEX", "TROPONINI" in the last 168 hours. BNP: Invalid input(s): "POCBNP" CBG: No results for input(s): "GLUCAP" in the last 168 hours. D-Dimer No results for input(s): "DDIMER" in the last 72 hours. Hgb A1c No results for input(s): "HGBA1C" in the last 72 hours. Lipid Profile No results for input(s): "CHOL", "HDL", "LDLCALC", "TRIG", "CHOLHDL", "LDLDIRECT" in the last 72 hours. Thyroid function studies No results for input(s): "TSH", "T4TOTAL", "T3FREE", "THYROIDAB" in the last 72 hours.  Invalid input(s): "FREET3" Anemia work up No results for input(s): "VITAMINB12", "FOLATE", "FERRITIN", "TIBC", "IRON", "RETICCTPCT" in the last 72 hours. Urinalysis    Component Value Date/Time   COLORURINE YELLOW 01/16/2023 2208   APPEARANCEUR HAZY (A) 01/16/2023 2208   LABSPEC 1.027 01/16/2023 2208   PHURINE 5.0 01/16/2023 2208   GLUCOSEU NEGATIVE 01/16/2023 2208   HGBUR MODERATE (A) 01/16/2023 2208   BILIRUBINUR NEGATIVE 01/16/2023 2208   KETONESUR 80 (A) 01/16/2023 2208   PROTEINUR 30 (A) 01/16/2023 2208   NITRITE NEGATIVE 01/16/2023 2208   LEUKOCYTESUR NEGATIVE 01/16/2023 2208   Sepsis Labs Recent Labs  Lab 01/16/23 2044 01/17/23 0350 01/18/23 0422  WBC 12.4* 12.2* 13.8*   Microbiology No results found for this or any previous visit (from the past 240 hour(s)).  FURTHER DISCHARGE INSTRUCTIONS:   Get Medicines reviewed and adjusted: Please take all your medications with you for your next visit with your Primary MD   Laboratory/radiological data: Please request your Primary MD to go over all hospital tests and procedure/radiological results at the follow up, please ask your Primary MD to get all Hospital records sent to his/her office.   In some cases, they will be blood work, cultures and biopsy results pending at the time of your discharge. Please request that  your primary care M.D. goes through all the records of your hospital data and follows up on these results.   Also Note the following: If you experience worsening of your admission symptoms, develop shortness of breath, life threatening emergency, suicidal or homicidal thoughts you must seek medical attention immediately by calling 911 or calling your MD immediately  if symptoms less severe.   You must read complete instructions/literature along with all the possible adverse reactions/side effects for all the Medicines you take and that have been prescribed to you. Take any new Medicines after you have completely understood and accpet all the possible adverse reactions/side effects.    Do not drive when taking Pain medications or sleeping medications (Benzodaizepines)   Do not take more than prescribed Pain, Sleep and Anxiety Medications. It is not advisable to combine anxiety,sleep and pain medications without talking with your primary care practitioner   Special Instructions: If you have smoked or chewed Tobacco  in the last 2 yrs please stop smoking, stop any regular Alcohol  and or any Recreational drug use.   Wear Seat belts while driving.   Please note: You were cared for by a hospitalist during your  hospital stay. Once you are discharged, your primary care physician will handle any further medical issues. Please note that NO REFILLS for any discharge medications will be authorized once you are discharged, as it is imperative that you return to your primary care physician (or establish a relationship with a primary care physician if you do not have one) for your post hospital discharge needs so that they can reassess your need for medications and monitor your lab values  Time coordinating discharge: Over 30 minutes  SIGNED:   Hughie Closs, MD  [facility] 01/18/2023, 10:56 AM *Please note that this is a verbal dictation therefore any spelling or grammatical errors are due to the  "[employer] One" system interpretation. If 7PM-7AM, please contact night-coverage [email or web address]

## 2023-01-18 NOTE — Anesthesia Postprocedure Evaluation (Signed)
Anesthesia Post Note  Patient: Alexandra Dennis  Procedure(s) Performed: LAPAROSCOPIC CHOLECYSTECTOMY WITH ICG     Patient location during evaluation: PACU Anesthesia Type: General Level of consciousness: awake and alert and oriented Pain management: pain level controlled Vital Signs Assessment: post-procedure vital signs reviewed and stable Respiratory status: spontaneous breathing, nonlabored ventilation and respiratory function stable Cardiovascular status: blood pressure returned to baseline and stable Postop Assessment: no apparent nausea or vomiting Anesthetic complications: no   No notable events documented.  Last Vitals:  Vitals:   01/17/23 1808 01/17/23 1922  BP: 111/65 115/66  Pulse: 83 73  Resp:  16  Temp: 36.7 C 36.9 C  SpO2: 94% 95%                   [name] A.

## 2023-01-19 LAB — SURGICAL PATHOLOGY

## 2023-01-31 ENCOUNTER — Ambulatory Visit (HOSPITAL_COMMUNITY)
Admission: RE | Admit: 2023-01-31 | Discharge: 2023-01-31 | Disposition: A | Discharge status: Home / Self Care | Payer: 59 | Source: Ambulatory Visit | Attending: Interventional Radiology | Admitting: Interventional Radiology

## 2023-01-31 DIAGNOSIS — I671 Cerebral aneurysm, nonruptured: Secondary | ICD-10-CM | POA: Insufficient documentation

## 2023-01-31 MED ORDER — GADOBUTROL 1 MMOL/ML IV SOLN
6.0000 mL | Freq: Once | INTRAVENOUS | Status: AC | PRN
  Administered 2023-01-31: 6 mL via INTRAVENOUS

## 2023-02-01 ENCOUNTER — Other Ambulatory Visit (HOSPITAL_COMMUNITY): Payer: Self-pay | Admitting: Surgery

## 2023-02-01 DIAGNOSIS — R1013 Epigastric pain: Secondary | ICD-10-CM

## 2023-02-02 ENCOUNTER — Ambulatory Visit (HOSPITAL_COMMUNITY)
Admission: RE | Admit: 2023-02-02 | Discharge: 2023-02-02 | Disposition: A | Discharge status: Home / Self Care | Payer: 59 | Source: Ambulatory Visit | Attending: Surgery | Admitting: Surgery

## 2023-02-02 DIAGNOSIS — R1013 Epigastric pain: Secondary | ICD-10-CM | POA: Insufficient documentation

## 2023-02-02 MED ORDER — IOHEXOL 300 MG/ML  SOLN
30.0000 mL | Freq: Once | INTRAMUSCULAR | Status: AC | PRN
  Administered 2023-02-02: 30 mL via ORAL

## 2023-02-02 MED ORDER — IOHEXOL 300 MG/ML  SOLN
100.0000 mL | Freq: Once | INTRAMUSCULAR | Status: AC | PRN
  Administered 2023-02-02: 100 mL via INTRAVENOUS

## 2023-02-07 ENCOUNTER — Other Ambulatory Visit (HOSPITAL_COMMUNITY): Payer: Self-pay | Admitting: General Surgery

## 2023-02-07 DIAGNOSIS — R1013 Epigastric pain: Secondary | ICD-10-CM

## 2023-02-09 ENCOUNTER — Encounter (HOSPITAL_COMMUNITY)
Admission: RE | Admit: 2023-02-09 | Discharge: 2023-02-09 | Disposition: A | Discharge status: Home / Self Care | Payer: 59 | Source: Ambulatory Visit | Attending: General Surgery | Admitting: General Surgery

## 2023-02-09 DIAGNOSIS — R1013 Epigastric pain: Secondary | ICD-10-CM | POA: Insufficient documentation

## 2023-02-09 MED ORDER — TECHNETIUM TC 99M SULFUR COLLOID
2.0000 | Freq: Once | INTRAVENOUS | Status: AC | PRN
  Administered 2023-02-09: 2 via INTRAVENOUS

## 2023-02-10 ENCOUNTER — Other Ambulatory Visit (HOSPITAL_COMMUNITY): Payer: Self-pay | Admitting: General Surgery

## 2023-02-10 DIAGNOSIS — R1013 Epigastric pain: Secondary | ICD-10-CM

## 2023-02-11 ENCOUNTER — Inpatient Hospital Stay (HOSPITAL_COMMUNITY): Admission: RE | Admit: 2023-02-11 | Payer: 59 | Source: Ambulatory Visit

## 2023-02-22 ENCOUNTER — Telehealth (HOSPITAL_COMMUNITY): Payer: Self-pay

## 2023-02-22 NOTE — Telephone Encounter (Signed)
Pt agreed to f/u in 2 years with a mri brain w/wo and mra head wo. AB

## 2023-02-22 NOTE — Telephone Encounter (Signed)
-----   Message from Hoyt Koch sent at 02/21/2023  3:40 PM EST ----- Per Dr. Corliss Skains, imaging is stable and she may repeat MRI w and w/o and MRA Brain in 1 year if she has intermittent/mild symptoms, 2 years if no symptoms   Thanks, Lannette Donath

## 2023-04-11 ENCOUNTER — Other Ambulatory Visit: Payer: Self-pay | Admitting: Internal Medicine

## 2023-04-11 DIAGNOSIS — Z1231 Encounter for screening mammogram for malignant neoplasm of breast: Secondary | ICD-10-CM

## 2023-05-09 ENCOUNTER — Ambulatory Visit
Admission: RE | Admit: 2023-05-09 | Discharge: 2023-05-09 | Disposition: A | Discharge status: Home / Self Care | Payer: 59 | Source: Ambulatory Visit | Attending: Internal Medicine | Admitting: Internal Medicine

## 2023-05-09 DIAGNOSIS — Z1231 Encounter for screening mammogram for malignant neoplasm of breast: Secondary | ICD-10-CM

## 2023-09-03 ENCOUNTER — Encounter (HOSPITAL_COMMUNITY): Payer: Self-pay | Admitting: Interventional Radiology

## 2023-11-23 ENCOUNTER — Other Ambulatory Visit (HOSPITAL_COMMUNITY): Payer: Self-pay | Admitting: Radiology

## 2023-11-23 ENCOUNTER — Telehealth (HOSPITAL_COMMUNITY): Payer: Self-pay | Admitting: Radiology

## 2023-11-23 NOTE — Telephone Encounter (Signed)
 Pt called and left me a VM asking about a referral since Dr. Dolphus has left. Called her back, left her VM to call me to discuss a referral for a new dr to follow her for brain aneurysm. JM

## 2023-12-15 ENCOUNTER — Other Ambulatory Visit (HOSPITAL_COMMUNITY): Payer: Self-pay | Admitting: Radiology

## 2023-12-15 DIAGNOSIS — I671 Cerebral aneurysm, nonruptured: Secondary | ICD-10-CM

## 2024-02-14 ENCOUNTER — Other Ambulatory Visit: Payer: Self-pay | Admitting: Internal Medicine

## 2024-02-14 DIAGNOSIS — C6992 Malignant neoplasm of unspecified site of left eye: Secondary | ICD-10-CM

## 2024-02-16 ENCOUNTER — Other Ambulatory Visit: Payer: Self-pay | Admitting: Internal Medicine

## 2024-02-16 ENCOUNTER — Ambulatory Visit
Admission: RE | Admit: 2024-02-16 | Discharge: 2024-02-16 | Disposition: A | Discharge status: Home / Self Care | Source: Ambulatory Visit | Attending: Internal Medicine | Admitting: Internal Medicine

## 2024-02-16 ENCOUNTER — Inpatient Hospital Stay: Admission: RE | Admit: 2024-02-16 | Discharge: 2024-02-16 | Attending: Internal Medicine | Admitting: Internal Medicine

## 2024-02-16 DIAGNOSIS — C6992 Malignant neoplasm of unspecified site of left eye: Secondary | ICD-10-CM

## 2024-02-22 ENCOUNTER — Encounter: Payer: Self-pay | Admitting: Neuroradiology

## 2024-02-22 ENCOUNTER — Ambulatory Visit: Payer: Self-pay | Admitting: Neuroradiology

## 2024-02-22 DIAGNOSIS — I671 Cerebral aneurysm, nonruptured: Secondary | ICD-10-CM

## 2024-02-22 DIAGNOSIS — B001 Herpesviral vesicular dermatitis: Secondary | ICD-10-CM

## 2024-02-22 DIAGNOSIS — Z95828 Presence of other vascular implants and grafts: Secondary | ICD-10-CM

## 2024-02-22 NOTE — Progress Notes (Signed)
 I had the pleasure of meeting with Alexandra Dennis in the office today to discuss her brain aneurysm.  She feels well.  She does not have any complaints of headaches, double vision or other neurologic abnormality.  Briefly, she had a giant left cavernous carotid aneurysm detected incidentally, and then treated with pipeline 01/31/2023.  She has had multiple follow-up CT arteriogram and MR arteriogram studies since that time, the last being an MRA 01/31/2023.  I personally reviewed the last MRA, the initial arteriogram from the pipeline treatment, and several of the MR angiograms and CT angiograms between the 2.  The aneurysm sac initially shrunk, and then has been stable at about 50 mm for the last several years.  The time-of-flight imaging shows a very small neck remnant.  Contrast-enhanced imaging on MRI shows some enhancement of the aneurysm sac, probably within the thrombus.  Assessment:  Giant left carotid cavernous aneurysm treated in 2015, stable size with probable minimal neck remnant.  Recommendation:  I explained to Alexandra Dennis that this type of aneurysm does not cause subarachnoid hemorrhage.  As such, a small remnant of the aneurysm is really not much concern.  Given its stability for the last several years, I think further imaging surveillance is really not necessary.  We did talk about the highly unlikely scenarios of growth of the aneurysm, which could cause double vision, or even more rare bleeding of the aneurysm which could cause swelling of the left eye.  She knows to seek medical attention and let me know if she develops any of the symptoms.  I have not scheduled any routine follow-up.  I am glad to see her again anytime as needed.
